# Patient Record
Sex: Female | Born: 1960 | Race: White | Hispanic: No | Marital: Married | Smoking: Never smoker
Health system: Southern US, Community
[De-identification: ages and names within clinical notes are randomized; demographics above are authoritative.]

## PROBLEM LIST (undated history)

## (undated) DIAGNOSIS — N6019 Diffuse cystic mastopathy of unspecified breast: Secondary | ICD-10-CM

## (undated) DIAGNOSIS — K219 Gastro-esophageal reflux disease without esophagitis: Secondary | ICD-10-CM

## (undated) DIAGNOSIS — I341 Nonrheumatic mitral (valve) prolapse: Secondary | ICD-10-CM

## (undated) DIAGNOSIS — K635 Polyp of colon: Secondary | ICD-10-CM

## (undated) HISTORY — DX: Gastro-esophageal reflux disease without esophagitis: K21.9

## (undated) HISTORY — DX: Diffuse cystic mastopathy of unspecified breast: N60.19

## (undated) HISTORY — PX: NO PAST SURGERIES: SHX2092

## (undated) HISTORY — DX: Polyp of colon: K63.5

## (undated) HISTORY — DX: Nonrheumatic mitral (valve) prolapse: I34.1

---

## 2001-02-16 ENCOUNTER — Encounter: Payer: Self-pay | Admitting: Obstetrics and Gynecology

## 2001-02-16 ENCOUNTER — Encounter: Admission: RE | Admit: 2001-02-16 | Discharge: 2001-02-16 | Payer: Self-pay | Admitting: Obstetrics and Gynecology

## 2001-03-17 ENCOUNTER — Encounter (INDEPENDENT_AMBULATORY_CARE_PROVIDER_SITE_OTHER): Payer: Self-pay | Admitting: *Deleted

## 2001-03-17 ENCOUNTER — Ambulatory Visit (HOSPITAL_COMMUNITY): Admission: RE | Admit: 2001-03-17 | Discharge: 2001-03-17 | Payer: Self-pay | Admitting: *Deleted

## 2002-02-28 ENCOUNTER — Encounter: Payer: Self-pay | Admitting: Obstetrics and Gynecology

## 2002-02-28 ENCOUNTER — Encounter: Admission: RE | Admit: 2002-02-28 | Discharge: 2002-02-28 | Payer: Self-pay | Admitting: Obstetrics and Gynecology

## 2002-03-07 ENCOUNTER — Other Ambulatory Visit: Admission: RE | Admit: 2002-03-07 | Discharge: 2002-03-07 | Payer: Self-pay | Admitting: Obstetrics and Gynecology

## 2003-02-13 ENCOUNTER — Encounter: Admission: RE | Admit: 2003-02-13 | Discharge: 2003-02-13 | Payer: Self-pay | Admitting: Obstetrics and Gynecology

## 2003-05-01 ENCOUNTER — Other Ambulatory Visit: Admission: RE | Admit: 2003-05-01 | Discharge: 2003-05-01 | Payer: Self-pay | Admitting: Obstetrics and Gynecology

## 2004-03-25 ENCOUNTER — Encounter: Admission: RE | Admit: 2004-03-25 | Discharge: 2004-03-25 | Payer: Self-pay | Admitting: Obstetrics and Gynecology

## 2004-05-01 ENCOUNTER — Ambulatory Visit (HOSPITAL_COMMUNITY): Admission: RE | Admit: 2004-05-01 | Discharge: 2004-05-01 | Payer: Self-pay | Admitting: *Deleted

## 2004-09-24 ENCOUNTER — Other Ambulatory Visit: Admission: RE | Admit: 2004-09-24 | Discharge: 2004-09-24 | Payer: Self-pay | Admitting: Obstetrics and Gynecology

## 2005-03-11 ENCOUNTER — Encounter: Admission: RE | Admit: 2005-03-11 | Discharge: 2005-03-11 | Payer: Self-pay | Admitting: Obstetrics and Gynecology

## 2005-09-29 ENCOUNTER — Other Ambulatory Visit: Admission: RE | Admit: 2005-09-29 | Discharge: 2005-09-29 | Payer: Self-pay | Admitting: Obstetrics and Gynecology

## 2006-12-25 ENCOUNTER — Other Ambulatory Visit: Admission: RE | Admit: 2006-12-25 | Discharge: 2006-12-25 | Payer: Self-pay | Admitting: Obstetrics and Gynecology

## 2006-12-28 ENCOUNTER — Encounter: Admission: RE | Admit: 2006-12-28 | Discharge: 2006-12-28 | Payer: Self-pay | Admitting: Obstetrics and Gynecology

## 2007-12-29 ENCOUNTER — Encounter: Admission: RE | Admit: 2007-12-29 | Discharge: 2007-12-29 | Payer: Self-pay | Admitting: Obstetrics and Gynecology

## 2007-12-29 ENCOUNTER — Other Ambulatory Visit: Admission: RE | Admit: 2007-12-29 | Discharge: 2007-12-29 | Payer: Self-pay | Admitting: Obstetrics & Gynecology

## 2010-08-20 ENCOUNTER — Other Ambulatory Visit: Payer: Self-pay | Admitting: Obstetrics and Gynecology

## 2010-08-20 DIAGNOSIS — Z1231 Encounter for screening mammogram for malignant neoplasm of breast: Secondary | ICD-10-CM

## 2010-09-03 ENCOUNTER — Ambulatory Visit
Admission: RE | Admit: 2010-09-03 | Discharge: 2010-09-03 | Disposition: A | Discharge status: Home / Self Care | Payer: BC Managed Care – PPO | Source: Ambulatory Visit | Attending: Obstetrics and Gynecology | Admitting: Obstetrics and Gynecology

## 2010-09-03 DIAGNOSIS — Z1231 Encounter for screening mammogram for malignant neoplasm of breast: Secondary | ICD-10-CM

## 2010-09-05 ENCOUNTER — Other Ambulatory Visit: Payer: Self-pay | Admitting: Obstetrics and Gynecology

## 2010-09-05 DIAGNOSIS — R928 Other abnormal and inconclusive findings on diagnostic imaging of breast: Secondary | ICD-10-CM

## 2010-09-11 ENCOUNTER — Other Ambulatory Visit: Payer: Self-pay | Admitting: Obstetrics and Gynecology

## 2010-09-11 ENCOUNTER — Ambulatory Visit
Admission: RE | Admit: 2010-09-11 | Discharge: 2010-09-11 | Disposition: A | Discharge status: Home / Self Care | Payer: BC Managed Care – PPO | Source: Ambulatory Visit | Attending: Obstetrics and Gynecology | Admitting: Obstetrics and Gynecology

## 2010-09-11 DIAGNOSIS — R928 Other abnormal and inconclusive findings on diagnostic imaging of breast: Secondary | ICD-10-CM

## 2011-08-12 ENCOUNTER — Other Ambulatory Visit: Payer: Self-pay | Admitting: Obstetrics & Gynecology

## 2011-08-12 DIAGNOSIS — Z1231 Encounter for screening mammogram for malignant neoplasm of breast: Secondary | ICD-10-CM

## 2011-09-04 ENCOUNTER — Ambulatory Visit: Payer: BC Managed Care – PPO

## 2011-09-08 ENCOUNTER — Ambulatory Visit
Admission: RE | Admit: 2011-09-08 | Discharge: 2011-09-08 | Disposition: A | Discharge status: Home / Self Care | Payer: BC Managed Care – PPO | Source: Ambulatory Visit | Attending: Obstetrics & Gynecology | Admitting: Obstetrics & Gynecology

## 2011-09-08 DIAGNOSIS — Z1231 Encounter for screening mammogram for malignant neoplasm of breast: Secondary | ICD-10-CM

## 2012-05-06 ENCOUNTER — Ambulatory Visit: Payer: Self-pay | Admitting: Certified Nurse Midwife

## 2012-05-10 ENCOUNTER — Encounter: Payer: Self-pay | Admitting: Certified Nurse Midwife

## 2012-05-13 ENCOUNTER — Encounter: Payer: Self-pay | Admitting: Certified Nurse Midwife

## 2012-05-13 ENCOUNTER — Ambulatory Visit (INDEPENDENT_AMBULATORY_CARE_PROVIDER_SITE_OTHER): Payer: BC Managed Care – PPO | Admitting: Certified Nurse Midwife

## 2012-05-13 VITALS — BP 94/60 | Ht 64.75 in | Wt 122.0 lb

## 2012-05-13 DIAGNOSIS — Z0000 Encounter for general adult medical examination without abnormal findings: Secondary | ICD-10-CM

## 2012-05-13 DIAGNOSIS — Z01419 Encounter for gynecological examination (general) (routine) without abnormal findings: Secondary | ICD-10-CM

## 2012-05-13 DIAGNOSIS — N760 Acute vaginitis: Secondary | ICD-10-CM

## 2012-05-13 LAB — LIPID PANEL
Cholesterol: 247 mg/dL — ABNORMAL HIGH (ref 0–200)
HDL: 66 mg/dL (ref >39)
LDL Cholesterol: 166 mg/dL — ABNORMAL HIGH (ref 0–99)
Total CHOL/HDL Ratio: 3.7 Ratio
Triglycerides: 76 mg/dL (ref <150)
VLDL: 15 mg/dL (ref 0–40)

## 2012-05-13 LAB — COMPREHENSIVE METABOLIC PANEL
Alkaline Phosphatase: 89 U/L (ref 39–117)
BUN: 13 mg/dL (ref 6–23)
Chloride: 100 mEq/L (ref 96–112)
Creat: 0.79 mg/dL (ref 0.50–1.10)
Potassium: 5.1 mEq/L (ref 3.5–5.3)
Total Bilirubin: 0.6 mg/dL (ref 0.3–1.2)
Total Protein: 7.5 g/dL (ref 6.0–8.3)

## 2012-05-13 LAB — POCT URINALYSIS DIPSTICK
Bilirubin, UA: NEGATIVE
Blood, UA: NEGATIVE
Clarity, UA: NEGATIVE
Color, UA: NEGATIVE
Ketones, UA: NEGATIVE
Protein, UA: NEGATIVE
pH, UA: 5

## 2012-05-13 NOTE — Progress Notes (Signed)
52 y.o. G72P2002 Married Caucasian Fe here for annual exam. Menopausal now, no vaginal bleeding or spotting since 05/23/11. Used Prometrium 9-13, with no withdrawal bleeding. Using Replens for vaginal dryness, working OK.  Patient describes hot flashes as off and on.  No insomnia issues. Patient was diagnosed with GERD and had negative EGD, under follow up.  No other health issues today.   Patient's last menstrual period was 05/23/2011.          Sexually active: yes  The current method of family planning is none.    Exercising: yes  walking Smoker:  no  Health Maintenance: Pap:  05-06-11 neg pap HPV HR neg MMG:  09-08-11 Colonoscopy:  06-13-11 BMD:   none TDaP:  2007 Labs: Poct urine-neg, Hgb-14.2   reports that she has never smoked. She does not have any smokeless tobacco history on file. She reports that she does not drink alcohol or use illicit drugs.  Past Medical History  Diagnosis Date  . MVP (mitral valve prolapse)   . Fibrocystic breast   . Acid reflux   . Colon polyps     History reviewed. No pertinent past surgical history.  Current Outpatient Prescriptions  Medication Sig Dispense Refill  . Cetirizine HCl (ZYRTEC PO) Take by mouth as needed.      . cycloSPORINE (RESTASIS) 0.05 % ophthalmic emulsion 1 drop 2 (two) times daily.      Marland Kitchen esomeprazole (NEXIUM) 40 MG capsule Take 40 mg by mouth daily before breakfast.      . Inulin (FIBERCHOICE PO) Take by mouth daily. With calcium & vitamin d      . KRILL OIL PO Take by mouth daily.      . Multiple Vitamins-Minerals (MULTIVITAMIN PO) Take by mouth daily.      . Probiotic Product (PHILLIPS COLON HEALTH PO) Take by mouth. occ       No current facility-administered medications for this visit.    Family History  Problem Relation Age of Onset  . Hyperlipidemia Mother   . Hyperlipidemia Father   . Cancer Maternal Grandfather     colon  . Breast cancer Paternal Grandmother     ROS:  Pertinent items are noted in HPI.   Otherwise, a comprehensive ROS was negative.  Exam:   BP 94/60  Ht 5' 4.75" (1.645 m)  Wt 122 lb (55.339 kg)  BMI 20.45 kg/m2  LMP 05/23/2011  Weight change: Height: 5' 4.75" (164.5 cm)  Ht Readings from Last 3 Encounters:  05/13/12 5' 4.75" (1.645 m)    General appearance: alert, cooperative and appears stated age Head: Normocephalic, without obvious abnormality, atraumatic Neck: no adenopathy, supple, symmetrical, trachea midline and thyroid normal to inspection and palpation Lungs: clear to auscultation bilaterally Breasts: normal appearance, no masses or tenderness, No nipple discharge or bleeding Heart: regular rate and rhythm Abdomen: soft, non-tender; no masses,  no organomegaly Extremities: extremities normal, atraumatic, no cyanosis or edema Skin: Skin color, texture, turgor normal. No rashes or lesions Lymph nodes: Cervical, supraclavicular, and axillary nodes normal. No abnormal inguinal nodes palpated Neurologic: Grossly normal   Pelvic: External genitalia:  no lesions              Urethra:  normal appearing urethra with no masses, tenderness or lesions              Bartholin's and Skene's: normal                 Vagina: normal appearing vagina with normal  color   Yellow odorous discharge noted.  Wet prep taken              Cervix: normal appearance              Pap taken: no Bimanual Exam:  Uterus:  normal size, contour, position, consistency, mobility, non-tender and anteverted              Adnexa: normal adnexa and no mass, fullness, tenderness               Rectovaginal: Confirms               Anus:  normal sphincter tone, no lesions    Wet Prep: Clue cells  A:  Well Woman with normal exam  Menopausal no HRT  BV  New diagnosis of GERD  P: Reviewed health and wellness pertinent to exam  Pap smear as per guidelines  Mammogram yearly Patient has Metrogel will use every hs x 5 nights. Restart Replens per OTC instructions, advise if dryness  continues Continue follow up as indicated  return annually or prn  An After Visit Summary was printed and given to the patient.  Reviewed, TL

## 2012-05-13 NOTE — Patient Instructions (Signed)

## 2012-05-14 ENCOUNTER — Telehealth: Payer: Self-pay | Admitting: Orthopedic Surgery

## 2012-05-14 NOTE — Telephone Encounter (Signed)
LMTCB about lab results.  aa

## 2012-05-14 NOTE — Telephone Encounter (Signed)
She will need copy of labs

## 2012-05-14 NOTE — Telephone Encounter (Signed)
Spoke with pt about increased cholesterol levels. Instructed that Dana Tucker would like her to follow up with her PCP for management. Pt agreeable. Instructed that her liver, kidney, and glucose values were normal. Pt has PCP and will make an appt.

## 2012-05-14 NOTE — Telephone Encounter (Signed)
Message copied by Alfredo Batty on Fri May 14, 2012 10:30 AM ------      Message from: Verner Chol      Created: Fri May 14, 2012  7:38 AM       Notify patient that liver, kidney, glucose profile normal      Cholesterol has increased with the overall cholesterol at 247 from 232 and LDL(harmful cholesterol) at 166 from 149      Feel she needs PCP management/follow up now.  If no PCP needs referral      DL ------

## 2012-06-02 ENCOUNTER — Ambulatory Visit (INDEPENDENT_AMBULATORY_CARE_PROVIDER_SITE_OTHER): Payer: BC Managed Care – PPO | Admitting: Obstetrics and Gynecology

## 2012-06-02 ENCOUNTER — Telehealth: Payer: Self-pay | Admitting: Certified Nurse Midwife

## 2012-06-02 VITALS — BP 120/80 | Wt 125.0 lb

## 2012-06-02 DIAGNOSIS — R350 Frequency of micturition: Secondary | ICD-10-CM

## 2012-06-02 DIAGNOSIS — N390 Urinary tract infection, site not specified: Secondary | ICD-10-CM

## 2012-06-02 LAB — POCT URINALYSIS DIPSTICK
Bilirubin, UA: NEGATIVE
Ketones, UA: NEGATIVE
Leukocytes, UA: NEGATIVE
Nitrite, UA: POSITIVE
Protein, UA: NEGATIVE
Urobilinogen, UA: NEGATIVE

## 2012-06-02 MED ORDER — NITROFURANTOIN MONOHYD MACRO 100 MG PO CAPS: 100.0000 mg | ORAL_CAPSULE | Freq: Two times a day (BID) | ORAL | Status: AC

## 2012-06-02 NOTE — Telephone Encounter (Signed)
Patient has developed extreme pain as a result of UTI.

## 2012-06-02 NOTE — Telephone Encounter (Signed)
Thank you for the note.  [employer]

## 2012-06-02 NOTE — Progress Notes (Signed)
Patient ID: Dana Tucker, female   DOB: [birth year], 52 y.o.   MRN: [id number]  Subjective  Patient here for potential urinary tract infection.  Urgency, dysuria starting this am.  No blood in the urine.  No fever.  Had recent intercourse.  Has been taking prophylactic antibiotics for intercourse.  Ran out of antibiotics. Last used Macrobid for prophylaxis.  Patient states headache and nausea, which came on after taking Pyridium.  Thinks is was expired.  Took ibuprofen for headache.   Did not take Nexium.    Objective   urine dip positive for nitrites  Assessment   Urinary tract infection   Plan  Stop Pyridium UC sent. Macrobid 100 mg po bid for 5 days. Rx will have an extra 20 capsules for patient will have abx on hand for prophylaxis for UTI with intercourse. Return for worsening symptoms, nausea, fever, back pain.  After visit summary to patient.

## 2012-06-02 NOTE — Telephone Encounter (Signed)
Pt complains of severe abdominal pain do to possible UTI (increased frequency/burning). I scheduled pt to come in office and see Dr. Edward Jolly at 3:00. Pt states she is currently in [place], [place] and will try to make 3:00 appt. Pt told to call the office if unable to make appt today. I advised pt to go to an urgent care if unable to make appt this afternoon.

## 2012-06-02 NOTE — Patient Instructions (Addendum)
Urinary Tract Infection  Urinary tract infections (UTIs) can develop anywhere along your urinary tract. Your urinary tract is your body's drainage system for removing wastes and extra water. Your urinary tract includes two kidneys, two ureters, a bladder, and a urethra. Your kidneys are a pair of bean-shaped organs. Each kidney is about the size of your fist. They are located below your ribs, one on each side of your spine.  CAUSES  Infections are caused by microbes, which are microscopic organisms, including fungi, viruses, and bacteria. These organisms are so small that they can only be seen through a microscope. Bacteria are the microbes that most commonly cause UTIs.  SYMPTOMS   Symptoms of UTIs may vary by age and gender of the patient and by the location of the infection. Symptoms in young women typically include a frequent and intense urge to urinate and a painful, burning feeling in the bladder or urethra during urination. Older women and men are more likely to be tired, shaky, and weak and have muscle aches and abdominal pain. A fever may mean the infection is in your kidneys. Other symptoms of a kidney infection include pain in your back or sides below the ribs, nausea, and vomiting.  DIAGNOSIS  To diagnose a UTI, your caregiver will ask you about your symptoms. Your caregiver also will ask to provide a urine sample. The urine sample will be tested for bacteria and white blood cells. White blood cells are made by your body to help fight infection.  TREATMENT   Typically, UTIs can be treated with medication. Because most UTIs are caused by a bacterial infection, they usually can be treated with the use of antibiotics. The choice of antibiotic and length of treatment depend on your symptoms and the type of bacteria causing your infection.  HOME CARE INSTRUCTIONS   If you were prescribed antibiotics, take them exactly as your caregiver instructs you. Finish the medication even if you feel better after you  have only taken some of the medication.   Drink enough water and fluids to keep your urine clear or pale yellow.   Avoid caffeine, tea, and carbonated beverages. They tend to irritate your bladder.   Empty your bladder often. Avoid holding urine for long periods of time.   Empty your bladder before and after sexual intercourse.   After a bowel movement, women should cleanse from front to back. Use each tissue only once.  SEEK MEDICAL CARE IF:    You have back pain.   You develop a fever.   Your symptoms do not begin to resolve within 3 days.  SEEK IMMEDIATE MEDICAL CARE IF:    You have severe back pain or lower abdominal pain.   You develop chills.   You have nausea or vomiting.   You have continued burning or discomfort with urination.  MAKE SURE YOU:    Understand these instructions.   Will watch your condition.   Will get help right away if you are not doing well or get worse.  Document Released: 10/09/2004 Document Revised: 07/01/2011 Document Reviewed: 02/07/2011  ExitCare Patient Information 2014 [employer].

## 2012-06-04 ENCOUNTER — Other Ambulatory Visit: Payer: Self-pay | Admitting: Obstetrics and Gynecology

## 2012-06-04 LAB — URINE CULTURE

## 2012-06-04 MED ORDER — CIPROFLOXACIN HCL 500 MG PO TABS: 500.0000 mg | ORAL_TABLET | Freq: Two times a day (BID) | ORAL | Status: DC

## 2012-06-09 ENCOUNTER — Telehealth: Payer: Self-pay | Admitting: Certified Nurse Midwife

## 2012-06-09 NOTE — Telephone Encounter (Signed)
LMTCB re: message from [place].  aa

## 2012-06-09 NOTE — Telephone Encounter (Signed)
Spoke with pt about symptoms. Pt used metronidazole as directed x 5 days and tried the Replens QD x 7, then decreased down to twice weekly. Pt having "globs of white gritty stuff like ricotta" that she is not sure how to get rid of. Causing increased irritation, redness, and some swelling.  Pt trying to get rid of UTI and was afraid to take a tub bath. Pt was told by DL to call back if no relief to the dryness. Pt thought an estrogen cream was the next step. Please advise.

## 2012-06-09 NOTE — Telephone Encounter (Signed)
Patient stated that she has spoken thoroughly with Leota Sauers concerning her issue of vaginal dryness. Patient stated that the issue hasn't improved but has actually gotten worse. Patient stated that she was told by Gavin Pound to call the office if this problem did not get better after trying the different solutions that Gavin Pound offered. Patient stated that she uses the [facility] on the corner of [address]

## 2012-06-09 NOTE — Telephone Encounter (Signed)
Spoke with pt about DL advice. Pt will try it and call us back if continued problems.

## 2012-06-09 NOTE — Telephone Encounter (Signed)
The vaginal discharge may be her body trying to re normalize after treating for infection. Have patient try aveeno sitz bath for symptom relief and advise if resolving, if so restart Replens daily

## 2012-06-09 NOTE — Telephone Encounter (Signed)
Patient returned Amy's call. 

## 2012-06-17 ENCOUNTER — Ambulatory Visit (INDEPENDENT_AMBULATORY_CARE_PROVIDER_SITE_OTHER): Payer: BC Managed Care – PPO | Admitting: Certified Nurse Midwife

## 2012-06-17 ENCOUNTER — Encounter: Payer: Self-pay | Admitting: Certified Nurse Midwife

## 2012-06-17 VITALS — BP 110/60 | HR 78 | Resp 16 | Wt 122.1 lb

## 2012-06-17 DIAGNOSIS — B3731 Acute candidiasis of vulva and vagina: Secondary | ICD-10-CM

## 2012-06-17 DIAGNOSIS — B373 Candidiasis of vulva and vagina: Secondary | ICD-10-CM

## 2012-06-17 DIAGNOSIS — N390 Urinary tract infection, site not specified: Secondary | ICD-10-CM

## 2012-06-17 MED ORDER — FLUCONAZOLE 150 MG PO TABS: 150.0000 mg | ORAL_TABLET | Freq: Once | ORAL | Status: DC

## 2012-06-17 NOTE — Progress Notes (Signed)
51 y.o. Married Caucasian female G2P2002 here for follow up of Klebisella UTI treated with Cipro initiated on 06-02-12. Completed all medication as directed.  Denies any symptoms of urinary frequency, urgency, or pain.  Complaining of excessive vaginal discharge, cottage cheese in consistency, no itching, for the past week.  No new personal products. No pain with sexual activity.    O: Healthy WD,WN female Affect:normal orientation x 3  Skin: warm and dry Abdomen:soft, non tender, negative suprapubic CVAT: negative Pelvic exam:EXTERNAL GENITALIA: normal appearing vulva with no masses, tenderness or lesions VAGINA: white thick copious discharge, Wet Prep/KOH  , positive hyphae, pH 4.0 and negative clue cells and negative trich BUS/bladder/urethra: non tender CERVIX: normal, non tender    A: UTI probably resolved 2-Yeast vaginitis  P: Discussed findings of UTI probably resolved 2- Discussed findings of yeast Rx Diflucan see order   Aveeno sitz bath prn comfort, start on oral probiotic   Labs:Urine culture   Rv prn Reviewed, TL

## 2012-07-22 ENCOUNTER — Other Ambulatory Visit: Payer: Self-pay | Admitting: Family Medicine

## 2012-07-22 ENCOUNTER — Ambulatory Visit
Admission: RE | Admit: 2012-07-22 | Discharge: 2012-07-22 | Disposition: A | Discharge status: Home / Self Care | Payer: BC Managed Care – PPO | Source: Ambulatory Visit | Attending: Family Medicine | Admitting: Family Medicine

## 2012-07-22 DIAGNOSIS — R0789 Other chest pain: Secondary | ICD-10-CM

## 2012-08-09 ENCOUNTER — Encounter: Payer: Self-pay | Admitting: Obstetrics & Gynecology

## 2012-08-10 ENCOUNTER — Ambulatory Visit (INDEPENDENT_AMBULATORY_CARE_PROVIDER_SITE_OTHER): Payer: BC Managed Care – PPO | Admitting: Certified Nurse Midwife

## 2012-08-10 ENCOUNTER — Encounter: Payer: Self-pay | Admitting: Obstetrics & Gynecology

## 2012-08-10 ENCOUNTER — Encounter: Payer: Self-pay | Admitting: Certified Nurse Midwife

## 2012-08-10 VITALS — BP 98/60 | HR 64 | Temp 97.9°F | Resp 16 | Ht 64.75 in | Wt 124.0 lb

## 2012-08-10 DIAGNOSIS — B3731 Acute candidiasis of vulva and vagina: Secondary | ICD-10-CM

## 2012-08-10 DIAGNOSIS — B373 Candidiasis of vulva and vagina: Secondary | ICD-10-CM

## 2012-08-10 MED ORDER — NYSTATIN-TRIAMCINOLONE 100000-0.1 UNIT/GM-% EX OINT: TOPICAL_OINTMENT | Freq: Two times a day (BID) | CUTANEOUS | Status: DC

## 2012-08-10 MED ORDER — FLUCONAZOLE 150 MG PO TABS
150.0000 mg | ORAL_TABLET | Freq: Once | ORAL | Status: DC
Start: 2012-08-10 — End: 2013-01-04

## 2012-08-10 NOTE — Progress Notes (Signed)
Note reviewed, agree with plan.  Tahisha Hakim, MD  

## 2012-08-10 NOTE — Progress Notes (Signed)
52 y.o.MarriedCaucasian female [phone] with a 7 day(s) history of the following:discharge described as green and thick, dyspareunia and external irritation Sexually active: yes Last sexual activity:7days ago. Pt also reports the following associated symptoms: painful. Patient has tried over the counter treatment with minimal relief. Questionable vaginal dryness again. No new personal products. Patient went to beach and was in hot tub several times, which seemed to start the symptoms..     Exam:  AVW:UJWJXB, Bartholin's, Urethra, Skene's normal                Vag:pH 4.0, wet prep done, white thick discharge, no odor, slight increase pink at introitus, no atrophy                Cx:  normal appearance                Uterus:normal shape and consistency, anteflexed                Adnexa: normal adnexa and no mass, fullness, tenderness  Wet Prep shows:Positive for yeast, negative for BV and Trich  A: Yeast vaginitis 2-Vaginal dryness  P: Reviewed findings and feel continued use of hot tub contributed to vaginal dryness and yeast production. Instructed to limit exposure for prolonged periods of time and apply Olive oil at introitus for protection prior to use. Rx: Diflucan see order [id number] see order 2-Discussed resuming Replens use as before until dryness resolves, can continue 2 x weekly for maintenance   Rv prn

## 2012-08-24 ENCOUNTER — Other Ambulatory Visit: Payer: Self-pay

## 2012-08-24 DIAGNOSIS — Z1231 Encounter for screening mammogram for malignant neoplasm of breast: Secondary | ICD-10-CM

## 2012-08-25 ENCOUNTER — Encounter: Payer: Self-pay | Admitting: Certified Nurse Midwife

## 2012-09-09 ENCOUNTER — Ambulatory Visit
Admission: RE | Admit: 2012-09-09 | Discharge: 2012-09-09 | Disposition: A | Discharge status: Home / Self Care | Payer: BC Managed Care – PPO | Source: Ambulatory Visit

## 2012-09-09 DIAGNOSIS — Z1231 Encounter for screening mammogram for malignant neoplasm of breast: Secondary | ICD-10-CM

## 2012-10-13 ENCOUNTER — Encounter: Payer: Self-pay | Admitting: Certified Nurse Midwife

## 2012-11-18 ENCOUNTER — Other Ambulatory Visit: Payer: Self-pay

## 2012-12-24 ENCOUNTER — Ambulatory Visit (INDEPENDENT_AMBULATORY_CARE_PROVIDER_SITE_OTHER): Payer: BC Managed Care – PPO | Admitting: Certified Nurse Midwife

## 2012-12-24 ENCOUNTER — Encounter: Payer: Self-pay | Admitting: Certified Nurse Midwife

## 2012-12-24 VITALS — BP 108/60 | HR 74 | Resp 18 | Wt 130.0 lb

## 2012-12-24 DIAGNOSIS — B373 Candidiasis of vulva and vagina: Secondary | ICD-10-CM

## 2012-12-24 DIAGNOSIS — B3731 Acute candidiasis of vulva and vagina: Secondary | ICD-10-CM

## 2012-12-24 LAB — HEMOGLOBIN A1C: Mean Plasma Glucose: 108 mg/dL (ref <117)

## 2012-12-24 MED ORDER — TERCONAZOLE 0.4 % VA CREA: 1.0000 | TOPICAL_CREAM | Freq: Every day | VAGINAL | Status: DC

## 2012-12-24 NOTE — Progress Notes (Signed)
52 y.o.MarriedCaucasian female [phone] with a 3 week(s) history of the following:burning, discharge described as clear and dyspareunia Sexually active: yes Last sexual activity:3weeks ago. Pt also reports the following associated symptoms: none Patient has tried over the counter treatment with no relief. Tried OTC Monistat 7 with no change and continues to have vaginal irritation.Denies vaginal dryness, uses OTC Replens and vaginal lubricant for sexual activity. No new personal products, except dryer sheets. No vaginal bleeding.  O: Healthy female, WDWN Affect: normal, orientation x 3    Exam:  AVW:[id number]'W, Urethra, Skene's normal, slight increase pink with exudate, no lesions wet prep done                GNF:AOZHYQMVH: white, thick and odorless, pH 4.0, wet prep done                Cx:  normal appearance and non tender                Uterus:non-tender, normal shape and consistency                Adnexa: normal adnexa and no mass, fullness, tenderness  Wet Prep shows: Positive for yeast vaginal and vulva   A: Yeast vaginitis and vulvitis ? Chronic  P:Reviewed findings of yeast again. Rx Terazol 7 see order Discussed screen for diabetes which can increase occurrence of yeast. Lab Hgb A1-c Start on probiotics, refrigerated type as before and take daily. Recheck in 1 1/2 weeks. Patient has Mycolog and will treat bid x 5 days to external tissue.    RV as above

## 2012-12-28 NOTE — Progress Notes (Signed)
Reviewed personally.  M. Suzanne Lorine Iannaccone, MD.  

## 2013-01-04 ENCOUNTER — Ambulatory Visit (INDEPENDENT_AMBULATORY_CARE_PROVIDER_SITE_OTHER): Payer: BC Managed Care – PPO | Admitting: Certified Nurse Midwife

## 2013-01-04 ENCOUNTER — Encounter: Payer: Self-pay | Admitting: Certified Nurse Midwife

## 2013-01-04 VITALS — BP 108/64 | HR 68 | Resp 16 | Ht 64.75 in | Wt 127.0 lb

## 2013-01-04 DIAGNOSIS — B3731 Acute candidiasis of vulva and vagina: Secondary | ICD-10-CM

## 2013-01-04 DIAGNOSIS — N949 Unspecified condition associated with female genital organs and menstrual cycle: Secondary | ICD-10-CM

## 2013-01-04 DIAGNOSIS — N9489 Other specified conditions associated with female genital organs and menstrual cycle: Secondary | ICD-10-CM

## 2013-01-04 DIAGNOSIS — B373 Candidiasis of vulva and vagina: Secondary | ICD-10-CM

## 2013-01-04 DIAGNOSIS — N952 Postmenopausal atrophic vaginitis: Secondary | ICD-10-CM

## 2013-01-04 LAB — POCT URINALYSIS DIPSTICK
Bilirubin, UA: NEGATIVE
Blood, UA: NEGATIVE
Glucose, UA: NEGATIVE
Ketones, UA: NEGATIVE
Protein, UA: NEGATIVE
Urobilinogen, UA: NEGATIVE

## 2013-01-04 NOTE — Progress Notes (Signed)
52 y.o. Married [place] American female [phone] here for follow up of Yeast vaginitis treated with Terazol 7 cream and Mycolog initiated on 12/24/12. Completed all medication as directed.  Denies any symptoms of vaginal discharge, itching or burning. Still some irritation externally, no itching, or lesions or blisters. Patient works out, but changes out of clothes soon after work out. Denies vaginal dryness. No other issues today  O: Healthy WD,WN female Affect: Normal, orientation x 3 Pelvic exam:EXTERNAL GENITALIA: normal appearing vulva with no masses, tenderness or lesions, very slight increase in pink, no exudate or scaling noted, wet prep taken VAGINA: no abnormal discharge or lesions, Wet Prep/KOH no pathogens and ph 4.0 CERVIX: no lesions or cervical motion tenderness and normal appearance UTERUS: normal, non tender ADNEXA: no masses palpable and nontender RECTUM: exam not indicated  A:Yeast vaginitis/vulvitis Resolved  Vulva dryness  P: Discussed findings of yeast resolved and no further treatment needed. Discussed dry appearance and instructed to apply Olive oil external and inside vulva for moisture and protection. Stop using dryer sheets which maybe causing irritation. Patient agreeable. Questions addressed.   RV prn

## 2013-01-04 NOTE — Patient Instructions (Signed)
Atrophic Vaginitis Atrophic vaginitis is a problem of low levels of estrogen in women. This problem can happen at any age. It is most common in women who have gone through menopause ("the change").  HOW WILL I KNOW IF I HAVE THIS PROBLEM? You may have:  Trouble with peeing (urinating), such as:  Going to the bathroom often.  A hard time holding your pee until you reach a bathroom.  Leaking pee.  Having pain when you pee.  Itching or a burning feeling.  Vaginal bleeding and spotting.  Pain during sex.  Dryness of the vagina.  A yellow, bad-smelling fluid (discharge) coming from the vagina. HOW WILL MY DOCTOR CHECK FOR THIS PROBLEM?  During your exam, your doctor will likely find the problem.  If there is a vaginal fluid, it may be checked for infection. HOW WILL THIS PROBLEM BE TREATED? Keep the vulvar skin as clean as possible. Moisturizers and lubricants can help with some of the symptoms. Estrogen replacement can help. There are 2 ways to take estrogen:  Systemic estrogen gets estrogen to your whole body. It takes many weeks or months before the symptoms get better.  You take an estrogen pill.  You use a skin patch. This is a patch that you put on your skin.  If you still have your uterus, your doctor may ask you to take a hormone. Talk to your doctor about the right medicine for you.  Estrogen cream.  This puts estrogen only at the part of your body where you apply it. The cream is put into the vagina or put on the vulvar skin. For some women, estrogen cream works faster than pills or the patch. CAN ALL WOMEN WITH THIS PROBLEM USE ESTROGEN? No. Women with certain types of cancer, liver problems, or problems with blood clots should not take estrogen. Your doctor can help you decide the best treatment for your symptoms. Document Released: 06/18/2007 Document Revised: 03/24/2011 Document Reviewed: 06/18/2007 ExitCare Patient Information 2014 [employer].  

## 2013-01-05 NOTE — Progress Notes (Signed)
Reviewed personally.  M. Suzanne Yakov Bergen, MD.  

## 2013-02-03 ENCOUNTER — Ambulatory Visit (INDEPENDENT_AMBULATORY_CARE_PROVIDER_SITE_OTHER): Payer: BC Managed Care – PPO | Admitting: Certified Nurse Midwife

## 2013-02-03 VITALS — BP 118/80 | HR 74 | Resp 12 | Ht 64.75 in | Wt 125.0 lb

## 2013-02-03 DIAGNOSIS — N952 Postmenopausal atrophic vaginitis: Secondary | ICD-10-CM

## 2013-02-03 DIAGNOSIS — IMO0002 Reserved for concepts with insufficient information to code with codable children: Secondary | ICD-10-CM

## 2013-02-03 MED ORDER — ESTRADIOL 10 MCG VA TABS: ORAL_TABLET | VAGINAL | Status: DC

## 2013-02-03 NOTE — Progress Notes (Signed)
53 y.o.MarriedCaucasian female [phone] with a 9 day(s) history of the following:discharge described as normal and physiologic and dyspareunia Sexually active: yes Last sexual activity:7days ago. Pt also reports the following associated symptoms: none. Patient denies any vaginal itching or burning, just tender. No new personal products. Patient using daily Olive oil with some relief.  O: Healthy  WDWN female Affect: normal, orientation x 3    Exam:  AVW:[id number]:Bartholin's, Urethra, Skene's normal, no lesion, scaling or exudate                GNF:AOZHYQMVHVag:discharge: scant and odorless, pH 4.5, wet prep done, vaginal tissue pale, thin appearance with increase pink at introitus with tenderness                Cx:  normal appearance and non tender                Uterus:non-tender, normal shape and consistency                Adnexa: normal adnexa and no mass, fullness, tenderness  Wet Prep shows:no pathogens   A:atrophic vaginitis Negative wet prep  P: Discussed findings with patient and that Olive Oil is not controlling symptoms and discomfort. Discussed estrogen use with risks and benefits and expectations. Patient agreeable to try. Encouraged to avoid intercourse until recheck, and to use Olive Oil for comfort external if needed.Questions addressed at length. Rx Vagifem see order Recheck in one month Educational materials distributed.  RV prn

## 2013-02-09 ENCOUNTER — Encounter: Payer: Self-pay | Admitting: Certified Nurse Midwife

## 2013-02-09 NOTE — Progress Notes (Signed)
Reviewed personally.  M. Suzanne Ritesh Opara, MD.  

## 2013-03-07 ENCOUNTER — Encounter: Payer: Self-pay | Admitting: Certified Nurse Midwife

## 2013-03-07 ENCOUNTER — Ambulatory Visit (INDEPENDENT_AMBULATORY_CARE_PROVIDER_SITE_OTHER): Payer: BC Managed Care – PPO | Admitting: Certified Nurse Midwife

## 2013-03-07 VITALS — BP 120/80 | HR 70 | Resp 16 | Ht 64.75 in | Wt 128.0 lb

## 2013-03-07 DIAGNOSIS — N952 Postmenopausal atrophic vaginitis: Secondary | ICD-10-CM

## 2013-03-07 NOTE — Patient Instructions (Signed)
Atrophic Vaginitis Atrophic vaginitis is a problem of low levels of estrogen in women. This problem can happen at any age. It is most common in women who have gone through menopause ("the change").  HOW WILL I KNOW IF I HAVE THIS PROBLEM? You may have:  Trouble with peeing (urinating), such as:  Going to the bathroom often.  A hard time holding your pee until you reach a bathroom.  Leaking pee.  Having pain when you pee.  Itching or a burning feeling.  Vaginal bleeding and spotting.  Pain during sex.  Dryness of the vagina.  A yellow, bad-smelling fluid (discharge) coming from the vagina. HOW WILL MY DOCTOR CHECK FOR THIS PROBLEM?  During your exam, your doctor will likely find the problem.  If there is a vaginal fluid, it may be checked for infection. HOW WILL THIS PROBLEM BE TREATED? Keep the vulvar skin as clean as possible. Moisturizers and lubricants can help with some of the symptoms. Estrogen replacement can help. There are 2 ways to take estrogen:  Systemic estrogen gets estrogen to your whole body. It takes many weeks or months before the symptoms get better.  You take an estrogen pill.  You use a skin patch. This is a patch that you put on your skin.  If you still have your uterus, your doctor may ask you to take a hormone. Talk to your doctor about the right medicine for you.  Estrogen cream.  This puts estrogen only at the part of your body where you apply it. The cream is put into the vagina or put on the vulvar skin. For some women, estrogen cream works faster than pills or the patch. CAN ALL WOMEN WITH THIS PROBLEM USE ESTROGEN? No. Women with certain types of cancer, liver problems, or problems with blood clots should not take estrogen. Your doctor can help you decide the best treatment for your symptoms. Document Released: 06/18/2007 Document Revised: 03/24/2011 Document Reviewed: 06/18/2007 ExitCare Patient Information 2014 [employer].  

## 2013-03-07 NOTE — Progress Notes (Signed)
53 y.o. Married Caucasian female G2P2002 here for follow up of Atrophic vaginitis treated with Vagifem initiated on 02/03/13. Patient completed 2 week loading dose and now using 2 times weekly. Denies vaginal itching and pain with intercourse now. Still has slight tenderness at entrance of vagina only. Worried it might be yeast again, but no symptoms as before. No new personal products, no vaginal bleeding. Now on Lipitor for elevated cholesterol with PCP management. No other health issues today.   O: Healthy WD,WN female Affect: normal, orientation x 3  Pelvic exam:EXTERNAL GENITALIA: normal appearing vulva with no masses, tenderness or lesions VAGINA: no abnormal discharge or lesions and discharge appears normal with moisture and no discomfort with speculum use. Wet Prep taken. Ph 4.0 Slight redness at introitus only, no scaling or tenderness CERVIX: no lesions or cervical motion tenderness and normal appearance.  Wet Prep: negative  A:Atrophic Vaginitis responding well to Vagifem Negative wet prep, no yeast noted   P: Discussed findings of normalizing appearance in vagina with Vagifem use. Discussed continued use to provide normal moisture an decrease dryness. Patient agrees. Rx already in from last visit. Reviewed no yeast!! Discussed slight dryness noted at introitus, but should improve with continued use. Encouraged continued use of moisturizer for sexual activity.  Rv aex, 5/15

## 2013-03-08 NOTE — Progress Notes (Signed)
Reviewed personally.  M. Suzanne Shonica Weier, MD.  

## 2013-03-28 ENCOUNTER — Encounter: Payer: Self-pay | Admitting: Certified Nurse Midwife

## 2013-04-12 ENCOUNTER — Other Ambulatory Visit: Payer: Self-pay | Admitting: Family Medicine

## 2013-04-12 ENCOUNTER — Ambulatory Visit
Admission: RE | Admit: 2013-04-12 | Discharge: 2013-04-12 | Disposition: A | Discharge status: Home / Self Care | Payer: BC Managed Care – PPO | Source: Ambulatory Visit | Attending: Family Medicine | Admitting: Family Medicine

## 2013-04-12 DIAGNOSIS — M62838 Other muscle spasm: Secondary | ICD-10-CM

## 2013-05-02 ENCOUNTER — Telehealth: Payer: Self-pay | Admitting: Certified Nurse Midwife

## 2013-05-02 DIAGNOSIS — N950 Postmenopausal bleeding: Secondary | ICD-10-CM

## 2013-05-02 NOTE — Telephone Encounter (Signed)
Thank you for doing a great job in preparing for the patient's visit.

## 2013-05-02 NOTE — Telephone Encounter (Signed)
Spoke with patient. She states that since after intercourse on Saturday she has been having brown and tinge of red spotting, she is wearing a liner only. She is concerned that she is post menopausal and having vaginal bleeding. Advised patient that office visit is suggested for evaluation. Patient requests Tuesday or Wednesday appointment, declines appointment today. Advised she would be given an appointment with an MD for evaluation. This could be r/t vaginal atrophy but that it must be ruled out first. Patient is agreeable.  Endometrial bx sent for precert. Wednesday appointment with Dr. Edward JollySilva scheduled for 0930. Patient is agreeable to plan.  Cc Verner Choleborah S. Leonard CNM

## 2013-05-02 NOTE — Telephone Encounter (Signed)
Patient calling to schedule an appointment for "unusual bleeding and spotting". Patient reports she is post menopausal.

## 2013-05-04 ENCOUNTER — Encounter: Payer: Self-pay | Admitting: Obstetrics and Gynecology

## 2013-05-04 ENCOUNTER — Ambulatory Visit (INDEPENDENT_AMBULATORY_CARE_PROVIDER_SITE_OTHER): Payer: BC Managed Care – PPO | Admitting: Obstetrics and Gynecology

## 2013-05-04 VITALS — BP 104/58 | HR 70 | Ht 64.75 in | Wt 122.6 lb

## 2013-05-04 DIAGNOSIS — N950 Postmenopausal bleeding: Secondary | ICD-10-CM

## 2013-05-04 NOTE — Patient Instructions (Signed)
Endometrial Biopsy, Care After Refer to this sheet in the next few weeks. These instructions provide you with information on caring for yourself after your procedure. Your health care provider may also give you more specific instructions. Your treatment has been planned according to current medical practices, but problems sometimes occur. Call your health care provider if you have any problems or questions after your procedure. WHAT TO EXPECT AFTER THE PROCEDURE After your procedure, it is typical to have the following:  You may have mild cramping and a small amount of vaginal bleeding for a few days after the procedure. This is normal. HOME CARE INSTRUCTIONS  Only take over-the-counter or prescription medicine as directed by your health care provider.  Do not douche, use tampons, or have sexual intercourse until your health care provider approves.  Follow your health care provider's instructions regarding any activity restrictions, such as strenuous exercise or heavy lifting. SEEK MEDICAL CARE IF:  You have heavy bleeding or bleeding longer than 2 days after the procedure.  You have bad smelling drainage from your vagina.  You have a fever and chills.  Youhave severe lower stomach (abdominal) pain. SEEK IMMEDIATE MEDICAL CARE IF:  You have severe cramps in your stomach or back.  You pass large blood clots.  Your bleeding increases.  You become weak or lightheaded, or you pass out. Document Released: 10/20/2012 Document Reviewed: 06/16/2012 Exit[employer] Patient Information 2014 [employer].  

## 2013-05-04 NOTE — Progress Notes (Addendum)
Patient ID: [name], female   DOB: 08/25/1960, 53 y.o.   MRN: [id number] GYNECOLOGY VISIT  PCP:   Tally Joeavid Swayne, MD  Referring provider:   HPI: 53 y.o.   Married  Caucasian  female   G2P2002 with Patient's last menstrual period was 05/18/2011.   here for  Abnormal uterine bleeding after intercourse. Brownish red bleeding.  Intercourse on 04/30/13 after one month of no intercourse. No pain with intercourse. No instrumentation used.  Bleeding since. Cramping.   Started Vagifem for atrophic vaginitis about 4 months ago. Using twice a week.  Some cramping with Vagifem use.   No other hormonal therapy use.   UPT:  Negative.   GYNECOLOGIC HISTORY: Patient's last menstrual period was 05/18/2011. Sexually active:  yes Partner preference: female Contraception:  postmenopausal  Menopausal hormone therapy: Vagifem DES exposure:  no  Blood transfusions:  no  Sexually transmitted diseases:   no GYN procedures and prior surgeries:  no Last mammogram:  08-2812 wnl:[facility]                 Last pap and high risk HPV testing: 05-06-11 wnl:neg HR HPV  History of abnormal pap smear:  no   OB History   Grav Para Term Preterm Abortions TAB SAB Ect Mult Living   2 2 2       2        LIFESTYLE: Exercise:   walking            Tobacco:   no Alcohol:      rarely Drug use:   no  Patient Active Problem List   Diagnosis Date Noted  . Atrophic vaginitis 02/03/2013    Class: Acute    Past Medical History  Diagnosis Date  . MVP (mitral valve prolapse)   . Fibrocystic breast   . Acid reflux   . Colon polyps     History reviewed. No pertinent past surgical history.  Current Outpatient Prescriptions  Medication Sig Dispense Refill  . atorvastatin (LIPITOR) 20 MG tablet daily. Take 1/2      . Estradiol (VAGIFEM) 10 MCG TABS vaginal tablet Insert one tablet nightly x 2 weeks, then one twice weekly  18 tablet  12  . Inulin (FIBERCHOICE PO) Take by mouth daily. With  calcium & vitamin d      . KRILL OIL PO Take by mouth daily.      . Multiple Vitamins-Minerals (MULTIVITAMIN PO) Take by mouth daily.      . Probiotic Product (PHILLIPS COLON HEALTH PO) Take by mouth. occ       No current facility-administered medications for this visit.     ALLERGIES: Sulfa antibiotics  Family History  Problem Relation Age of Onset  . Hyperlipidemia Mother   . Hyperlipidemia Father   . Cancer Maternal Grandfather     colon  . Diabetes Maternal Grandfather   . Breast cancer Paternal Grandmother     History   Social History  . Marital Status: Married    Spouse Name: N/A    Number of Children: N/A  . Years of Education: N/A   Occupational History  . Not on file.   Social History Main Topics  . Smoking status: Never Smoker   . Smokeless tobacco: Never Used  . Alcohol Use: 0.5 oz/week    1 drink(s) per week     Comment: occ glass of wine  . Drug Use: No  . Sexual Activity: Yes    Partners: Male  Birth Control/ Protection: Condom, None, Post-menopausal   Other Topics Concern  . Not on file   Social History Narrative  . No narrative on file    ROS:  Pertinent items are noted in HPI.  PHYSICAL EXAMINATION:    BP 104/58  Pulse 70  Ht 5' 4.75" (1.645 m)  Wt 122 lb 9.6 oz (55.611 kg)  BMI 20.55 kg/m2  LMP 05/18/2011   Wt Readings from Last 3 Encounters:  05/04/13 122 lb 9.6 oz (55.611 kg)  03/07/13 128 lb (58.06 kg)  02/03/13 125 lb (56.7 kg)     Ht Readings from Last 3 Encounters:  05/04/13 5' 4.75" (1.645 m)  03/07/13 5' 4.75" (1.645 m)  02/03/13 5' 4.75" (1.645 m)    General appearance: alert, cooperative and appears stated age. Abdomen: soft, non-tender; no masses,  no organomegaly No abnormal inguinal nodes palpated Neurologic: Grossly normal  Pelvic: External genitalia:  no lesions              Urethra:  normal appearing urethra with no masses, tenderness or lesions              Bartholins and Skenes: normal                  Vagina: normal appearing vagina with normal color and discharge, no lesions.  Dark brownish mucousy discharge.  Small spot of pink blood on cervix.  No lesions.              Cervix: normal appearance                 Bimanual Exam:  Uterus:  uterus is normal size, shape, consistency and nontender                                      Adnexa: normal adnexa in size, nontender and no masses                Endometrial biopsy -   Consent performed.  Speculum placed in vagina.  Cleansing with Hibiclens. Tenaculum to anterior cervical lip.  Paracervical block with 1% lidocaine - 10 cc.  Addendum - Lot number -    [id number]    [id number]     EXP - 08/13/13 Pipelle passed twice. Tissue to pathology. No complications.                           ASSESSMENT  Postmenopausal bleeding.  Atrophic vaginitis.   PLAN  Discussion with patient regarding potential etiologies of postmenopausal bleeding.  Questions invited and answered.  EMB sent to pathology.  OK to continue with Vagifem but not place while having red bleeding from procedure today.  I discussed sonohysterogram with the patient. She will return for this.    15 minutes face to face time discussion postmenopausal bleeding of which over 50% was spent in counseling.   An After Visit Summary was printed and given to the patient.

## 2013-05-06 LAB — IPS OTHER TISSUE BIOPSY

## 2013-05-09 ENCOUNTER — Telehealth: Payer: Self-pay | Admitting: Obstetrics and Gynecology

## 2013-05-09 NOTE — Telephone Encounter (Signed)
Spoke with patient. Advised of $25 copay quoted by insurance for [facility]HGM performed in the office. Scheduled appt. Advised patient of cancellation policy/fee. Patient agreeable.  Mailed the In-Office procedure form that includes appointment date and time, patient copay, and cancellation policy.

## 2013-05-16 ENCOUNTER — Ambulatory Visit: Payer: BC Managed Care – PPO | Admitting: Certified Nurse Midwife

## 2013-05-26 ENCOUNTER — Other Ambulatory Visit: Payer: BC Managed Care – PPO

## 2013-05-26 ENCOUNTER — Ambulatory Visit (INDEPENDENT_AMBULATORY_CARE_PROVIDER_SITE_OTHER): Payer: BC Managed Care – PPO | Admitting: Obstetrics and Gynecology

## 2013-05-26 ENCOUNTER — Encounter: Payer: Self-pay | Admitting: Obstetrics and Gynecology

## 2013-05-26 ENCOUNTER — Other Ambulatory Visit: Payer: BC Managed Care – PPO | Admitting: Obstetrics and Gynecology

## 2013-05-26 ENCOUNTER — Other Ambulatory Visit: Payer: Self-pay | Admitting: Obstetrics and Gynecology

## 2013-05-26 ENCOUNTER — Ambulatory Visit (INDEPENDENT_AMBULATORY_CARE_PROVIDER_SITE_OTHER): Payer: BC Managed Care – PPO

## 2013-05-26 VITALS — BP 140/90

## 2013-05-26 DIAGNOSIS — N9489 Other specified conditions associated with female genital organs and menstrual cycle: Secondary | ICD-10-CM

## 2013-05-26 DIAGNOSIS — N950 Postmenopausal bleeding: Secondary | ICD-10-CM

## 2013-05-26 MED ORDER — FLUCONAZOLE 150 MG PO TABS: 150.0000 mg | ORAL_TABLET | Freq: Once | ORAL | Status: DC

## 2013-05-26 NOTE — Progress Notes (Signed)
  Subjective  Patient is here for a sonohysterogram for postmenopausal bleeding. Had an EMB showing atrophy of the endometrium.  Uses Vagifem.  Recent antibiotics.  Objective  Ultrasound showing endometrium 4.04 with echogenic focus, no fibroids, ovaries normal, no free fluid.     Procedure - sonohysterogram Consent performed. Speculum placed in vagina. Clumpy white thick vaginal discharge noted. Sterile prep of cervix with betadine. Cannula placed inside endometrial cavity without difficult. Speculum removed. Sterile saline injected.    One 7 mm  filling defect noted in fundal region. Cannula removed. No complication.   Assessment  Postmenopausal bleeding.   Endometrial mass. Negative endometrial biopsy. Yeast vaginitis.   Plan  Diflucan.  See Epic. Discussion regarding findings of suspected endometrial polyp as the source of postmenopausal bleeding. Plan for hysteroscopic polypectomy with dilation and curettage.  Benefits and risks discussed including but not limited to bleeding, infection, damage to surrounding organs, uterine perforation and subsequent laparoscopy and hospital observation, fluid overload and pulmonary edema with hyponatremia, recurrence of endometrial polyps.  Patient wishes to proceed.  ACOG handouts on the above surgical procedures to patient.   25 minutes face to face time of which over 50% was spent in counseling.   After visit summary to patient.

## 2013-06-01 ENCOUNTER — Telehealth: Payer: Self-pay | Admitting: Obstetrics and Gynecology

## 2013-06-01 NOTE — Telephone Encounter (Signed)
Left message for patient to call back. Need to go over surgery benefits. °

## 2013-06-01 NOTE — Telephone Encounter (Signed)
Returning a call to Sabrina. °

## 2013-06-02 NOTE — Telephone Encounter (Signed)
Spoke with patient. Advised that per benefit quote received, she will be responsible for $563.27 for surgeons fees. Advised that once the surgery is scheduled, payment will be due to the office in full at least 2 weeks prior to the scheduled date. Patient agreeable. Hoping to have surgery performed 06/16 or 06/23  (made no guarantees regarding date). [facility]

## 2013-06-09 NOTE — Telephone Encounter (Signed)
Patient calling asking for Kennon Rounds to return her call. The patient is wanting to schedule her surgery so her husband can request time off from work. The patient is aware Kennon Rounds is off today.

## 2013-06-10 NOTE — Telephone Encounter (Signed)
MAILED LETTER TO PATIENT::  Jun 10, 2013   Dear Ms. Dana Tucker,  Your surgery is scheduled for July 05, 2013.  Upon requesting authorization for your surgery, your insurance company has informed us that they will cover 60% of the charges after a $3500 deductible, and you will be responsible to pay approximately $563.27.  It is our office policy that this amount is paid in full two weeks prior to your surgery. Your payment is due on June 09.  If there is a balance due after your insurance company pays their portion, we will send you a bill.  If there is a refund due to you, we will send you a check within one month.  Payment may be made by cash, check, Visa, Environmental education officer. Payment can be made in the office or over the telephone.  If payment is not made two weeks prior to your surgery, we will have to reschedule your surgery.  The above fee includes only our fee for the surgery and does not include charges you may have from the facility, anesthesia or pathology.  If you have any questions, please call us at [phone].

## 2013-06-10 NOTE — Telephone Encounter (Signed)
Dr Edward Jolly, can this recheck be done at preop?

## 2013-06-10 NOTE — Telephone Encounter (Signed)
Noted, appointment with Debbi canceled.

## 2013-06-10 NOTE — Telephone Encounter (Signed)
Thank you for the update!

## 2013-06-10 NOTE — Telephone Encounter (Signed)
I can do a recheck with the patient regarding her estrogen cream at the same time as the preop visit.

## 2013-06-10 NOTE — Telephone Encounter (Signed)
Surgery scheduled for 07-05-13 at 1130. Surgery instruction sheet reviewed and mailed.  Patient has 3 month recheck (with Debbi) after beginning estrogen cream scheduled for same day as preop. Can she have this rechecked at preop?

## 2013-06-14 ENCOUNTER — Ambulatory Visit: Payer: BC Managed Care – PPO | Admitting: Certified Nurse Midwife

## 2013-06-20 ENCOUNTER — Encounter: Payer: Self-pay | Admitting: Obstetrics and Gynecology

## 2013-06-20 ENCOUNTER — Institutional Professional Consult (permissible substitution): Payer: BC Managed Care – PPO | Admitting: Obstetrics and Gynecology

## 2013-06-20 ENCOUNTER — Ambulatory Visit: Payer: BC Managed Care – PPO | Admitting: Certified Nurse Midwife

## 2013-06-20 ENCOUNTER — Ambulatory Visit (INDEPENDENT_AMBULATORY_CARE_PROVIDER_SITE_OTHER): Payer: BC Managed Care – PPO | Admitting: Obstetrics and Gynecology

## 2013-06-20 VITALS — BP 118/75 | HR 60 | Ht 64.75 in | Wt 121.5 lb

## 2013-06-20 DIAGNOSIS — N9489 Other specified conditions associated with female genital organs and menstrual cycle: Secondary | ICD-10-CM

## 2013-06-20 DIAGNOSIS — N950 Postmenopausal bleeding: Secondary | ICD-10-CM

## 2013-06-20 NOTE — Progress Notes (Signed)
Patient ID: Dana Tucker, female   DOB: [birth year], 53 y.o.   MRN: [id number] GYNECOLOGY VISIT  PCP: Tally Joe, MD  Referring provider:   HPI: 53 y.o.   Married  Caucasian  female   G2P2002 with Patient's last menstrual period was 05/18/2011.   here to discuss surgery.   Patient presented with postmenopausal bleeding following intercourse.  Has not had any further bleeding.   Pelvic ultrasound showing endometrial mass 4.04 mm and sonohysterogram showing 7 mm filling defect. Myometrium normal  Normal ovaries.  EMB benign.  Using Vagifem.  Has mitral valve prolapse.  Occasional palpitations.  Does not take antibiotics prior to procedures.  Did have an ECHO in 2006.  GYNECOLOGIC HISTORY: Patient's last menstrual period was 05/18/2011. Sexually active:  yes Partner preference: female Contraception: postmenopausal  Menopausal hormone therapy: Vagifem DES exposure: no   Blood transfusions: no   Sexually transmitted diseases: no GYN procedures and prior surgeries: no  Last mammogram:  08/2012 wnl:[facility]               Last pap and high risk HPV testing:  05-05-12 wnl:neg HR HPV.  History of abnormal pap smear:  no   OB History   Grav Para Term Preterm Abortions TAB SAB Ect Mult Living   2 2 2       2        LIFESTYLE: Exercise:   walking            Tobacco:   no Alcohol:    rarely Drug use:  no  Patient Active Problem List   Diagnosis Date Noted  . Postmenopausal bleeding 05/04/2013  . Atrophic vaginitis 02/03/2013    Class: Acute    Past Medical History  Diagnosis Date  . MVP (mitral valve prolapse)   . Fibrocystic breast   . Acid reflux   . Colon polyps     History reviewed. No pertinent past surgical history.  Current Outpatient Prescriptions  Medication Sig Dispense Refill  . atorvastatin (LIPITOR) 20 MG tablet daily. Take 1/2      . Estradiol (VAGIFEM) 10 MCG TABS vaginal tablet Insert one tablet nightly x 2 weeks, then one twice weekly   18 tablet  12  . Inulin (FIBERCHOICE PO) Take by mouth daily. With calcium & vitamin d      . KRILL OIL PO Take by mouth daily.      . Multiple Vitamins-Minerals (MULTIVITAMIN PO) Take by mouth daily.      . Probiotic Product (PHILLIPS COLON HEALTH PO) Take by mouth. occ       No current facility-administered medications for this visit.     ALLERGIES: Sulfa antibiotics  Family History  Problem Relation Age of Onset  . Hyperlipidemia Mother   . Hyperlipidemia Father   . Cancer Maternal Grandfather     colon  . Diabetes Maternal Grandfather   . Breast cancer Paternal Grandmother     History   Social History  . Marital Status: Married    Spouse Name: N/A    Number of Children: N/A  . Years of Education: N/A   Occupational History  . Not on file.   Social History Main Topics  . Smoking status: Never Smoker   . Smokeless tobacco: Never Used  . Alcohol Use: 0.5 oz/week    1 drink(s) per week     Comment: occ glass of wine  . Drug Use: No  . Sexual Activity: Yes    Partners: Male  Birth Control/ Protection: Condom, None, Post-menopausal   Other Topics Concern  . Not on file   Social History Narrative  . No narrative on file    ROS:  Pertinent items are noted in HPI.  PHYSICAL EXAMINATION:    BP 118/75  Pulse 60  Ht 5' 4.75" (1.645 m)  Wt 121 lb 8 oz (55.112 kg)  BMI 20.37 kg/m2  LMP 05/18/2011   Wt Readings from Last 3 Encounters:  06/20/13 121 lb 8 oz (55.112 kg)  05/04/13 122 lb 9.6 oz (55.611 kg)  03/07/13 128 lb (58.06 kg)     Ht Readings from Last 3 Encounters:  06/20/13 5' 4.75" (1.645 m)  05/04/13 5' 4.75" (1.645 m)  03/07/13 5' 4.75" (1.645 m)    General appearance: alert, cooperative and appears stated age Head: Normocephalic, without obvious abnormality, atraumatic Neck: no adenopathy, supple, symmetrical, trachea midline and thyroid not enlarged, symmetric, no tenderness/mass/nodules Lungs: clear to auscultation bilaterally Heart:  regular rate and rhythm Abdomen: soft, non-tender; no masses,  no organomegaly Extremities: extremities normal, atraumatic, no cyanosis or edema Skin: Skin color, texture, turgor normal. No rashes or lesions Lymph nodes: Cervical, supraclavicular, and axillary nodes normal. No abnormal inguinal nodes palpated Neurologic: Grossly normal  Pelvic: External genitalia:  no lesions              Urethra:  normal appearing urethra with no masses, tenderness or lesions              Bartholins and Skenes: normal                 Vagina: normal appearing vagina with normal color and discharge, no lesions              Cervix: normal appearance                 Bimanual Exam:  Uterus:  uterus is normal size, shape, consistency and nontender                                      Adnexa: normal adnexa in size, nontender and no masses                                  ASSESSMENT  Postmenopausal bleeding.  Suspected polyp.  Endometrial mass noted on pelvic ultrasound and sonohysterogram.   Negative EMB. Vagifem patient.   PLAN  Hysteroscopic polypectomy with dilation and curettage.  Risks, benefits, and alternatives reviewed with the patient who wishes to proceed.   An After Visit Summary was printed and given to the patient.  15 minutes face to face time of which over 505 was spent in counseling.

## 2013-06-27 ENCOUNTER — Encounter (HOSPITAL_COMMUNITY): Payer: Self-pay | Admitting: Pharmacist

## 2013-06-29 ENCOUNTER — Encounter (HOSPITAL_COMMUNITY): Payer: Self-pay | Admitting: *Deleted

## 2013-07-04 ENCOUNTER — Encounter (HOSPITAL_COMMUNITY): Payer: Self-pay | Admitting: Anesthesiology

## 2013-07-04 NOTE — H&P (Signed)
Brook E Amundson de Gwenevere Ghaziarvalho E Silva, MD at 06/20/2013  1:29 PM    Status: Signed             Patient ID: Dana Tucker, female   DOB: 09/25/1960, 53 y.o.   MRN: [id number] GYNECOLOGY VISIT   PCP: Tally Joeavid Swayne, MD   Referring provider:    HPI: 53 y.o.   Married  Caucasian  female    G2P2002 with Patient's last menstrual period was 05/18/2011.    here to discuss surgery.    Patient presented with postmenopausal bleeding following intercourse.   Has not had any further bleeding.    Pelvic ultrasound showing endometrial mass 4.04 mm and sonohysterogram showing 7 mm filling defect. Myometrium normal  Normal ovaries.   EMB benign.   Using Vagifem.   Has mitral valve prolapse.   Occasional palpitations.   Does not take antibiotics prior to procedures.   Did have an ECHO in 2006.   GYNECOLOGIC HISTORY: Patient's last menstrual period was 05/18/2011. Sexually active:  yes Partner preference: female Contraception: postmenopausal   Menopausal hormone therapy: Vagifem DES exposure: no    Blood transfusions: no    Sexually transmitted diseases: no GYN procedures and prior surgeries: no   Last mammogram:  08/2012 wnl:[facility]                Last pap and high risk HPV testing:  05-05-12 wnl:neg HR HPV.   History of abnormal pap smear:  no    OB History     Grav  Para  Term  Preterm  Abortions  TAB  SAB  Ect  Mult  Living     2  2  2              2            LIFESTYLE: Exercise:   walking             Tobacco:   no Alcohol:    rarely Drug use:  no    Patient Active Problem List     Diagnosis  Date Noted   .  Postmenopausal bleeding  05/04/2013   .  Atrophic vaginitis  02/03/2013       Class: Acute         Past Medical History   Diagnosis  Date   .  MVP (mitral valve prolapse)     .  Fibrocystic breast     .  Acid reflux     .  Colon polyps          History reviewed. No pertinent past surgical history.    Current Outpatient Prescriptions    Medication  Sig  Dispense  Refill   .  atorvastatin (LIPITOR) 20 MG tablet  daily. Take 1/2         .  Estradiol (VAGIFEM) 10 MCG TABS vaginal tablet  Insert one tablet nightly x 2 weeks, then one twice weekly   18 tablet   12   .  Inulin (FIBERCHOICE PO)  Take by mouth daily. With calcium & vitamin d         .  KRILL OIL PO  Take by mouth daily.         .  Multiple Vitamins-Minerals (MULTIVITAMIN PO)  Take by mouth daily.         .  Probiotic Product (PHILLIPS COLON HEALTH PO)  Take by mouth. occ  No current facility-administered medications for this visit.        ALLERGIES: Sulfa antibiotics    Family History   Problem  Relation  Age of Onset   .  Hyperlipidemia  Mother     .  Hyperlipidemia  Father     .  Cancer  Maternal Grandfather         colon   .  Diabetes  Maternal Grandfather     .  Breast cancer  Paternal Grandmother           History       Social History   .  Marital Status:  Married       Spouse Name:  N/A       Number of Children:  N/A   .  Years of Education:  N/A       Occupational History   .  Not on file.       Social History Main Topics   .  Smoking status:  Never Smoker    .  Smokeless tobacco:  Never Used   .  Alcohol Use:  0.5 oz/week       1 drink(s) per week         Comment: occ glass of wine   .  Drug Use:  No   .  Sexual Activity:  Yes       Partners:  Male       Birth Control/ Protection:  Condom, None, Post-menopausal       Other Topics  Concern   .  Not on file       Social History Narrative   .  No narrative on file        ROS:  Pertinent items are noted in HPI.   PHYSICAL EXAMINATION:     BP 118/75  Pulse 60  Ht 5' 4.75" (1.645 m)  Wt 121 lb 8 oz (55.112 kg)  BMI 20.37 kg/m2  LMP 05/18/2011    Wt Readings from Last 3 Encounters:   06/20/13  121 lb 8 oz (55.112 kg)   05/04/13  122 lb 9.6 oz (55.611 kg)   03/07/13  128 lb (58.06 kg)        Ht Readings from Last 3 Encounters:   06/20/13  5'  4.75" (1.645 m)   05/04/13  5' 4.75" (1.645 m)   03/07/13  5' 4.75" (1.645 m)        General appearance: alert, cooperative and appears stated age Head: Normocephalic, without obvious abnormality, atraumatic Neck: no adenopathy, supple, symmetrical, trachea midline and thyroid not enlarged, symmetric, no tenderness/mass/nodules Lungs: clear to auscultation bilaterally Heart: regular rate and rhythm Abdomen: soft, non-tender; no masses,  no organomegaly Extremities: extremities normal, atraumatic, no cyanosis or edema Skin: Skin color, texture, turgor normal. No rashes or lesions Lymph nodes: Cervical, supraclavicular, and axillary nodes normal. No abnormal inguinal nodes palpated Neurologic: Grossly normal   Pelvic: External genitalia:  no lesions              Urethra:  normal appearing urethra with no masses, tenderness or lesions              Bartholins and Skenes: normal                  Vagina: normal appearing vagina with normal color and discharge, no lesions              Cervix: normal appearance  Bimanual Exam:  Uterus:  uterus is normal size, shape, consistency and nontender                                      Adnexa: normal adnexa in size, nontender and no masses                                   ASSESSMENT   Postmenopausal bleeding.   Suspected polyp.  Endometrial mass noted on pelvic ultrasound and sonohysterogram.   Negative EMB. Vagifem patient.    PLAN   Hysteroscopic polypectomy with dilation and curettage.  Risks, benefits, and alternatives reviewed with the patient who wishes to proceed.    An After Visit Summary was printed and given to the patient.   15 minutes face to face time of which over 50% was spent in counseling.

## 2013-07-05 ENCOUNTER — Encounter (HOSPITAL_COMMUNITY): Payer: BC Managed Care – PPO | Admitting: Anesthesiology

## 2013-07-05 ENCOUNTER — Ambulatory Visit (HOSPITAL_COMMUNITY): Payer: BC Managed Care – PPO | Admitting: Anesthesiology

## 2013-07-05 ENCOUNTER — Encounter (HOSPITAL_COMMUNITY): Payer: Self-pay

## 2013-07-05 ENCOUNTER — Ambulatory Visit (HOSPITAL_COMMUNITY)
Admission: RE | Admit: 2013-07-05 | Discharge: 2013-07-05 | Disposition: A | Discharge status: Home / Self Care | Payer: BC Managed Care – PPO | Source: Ambulatory Visit | Attending: Obstetrics and Gynecology | Admitting: Obstetrics and Gynecology

## 2013-07-05 ENCOUNTER — Encounter (HOSPITAL_COMMUNITY)
Admission: RE | Disposition: A | Discharge status: Home / Self Care | Payer: Self-pay | Source: Ambulatory Visit | Attending: Obstetrics and Gynecology

## 2013-07-05 DIAGNOSIS — N950 Postmenopausal bleeding: Secondary | ICD-10-CM

## 2013-07-05 DIAGNOSIS — N840 Polyp of corpus uteri: Secondary | ICD-10-CM

## 2013-07-05 DIAGNOSIS — Z8249 Family history of ischemic heart disease and other diseases of the circulatory system: Secondary | ICD-10-CM | POA: Insufficient documentation

## 2013-07-05 DIAGNOSIS — Z833 Family history of diabetes mellitus: Secondary | ICD-10-CM | POA: Insufficient documentation

## 2013-07-05 DIAGNOSIS — Z800 Family history of malignant neoplasm of digestive organs: Secondary | ICD-10-CM | POA: Insufficient documentation

## 2013-07-05 DIAGNOSIS — K219 Gastro-esophageal reflux disease without esophagitis: Secondary | ICD-10-CM | POA: Insufficient documentation

## 2013-07-05 DIAGNOSIS — R002 Palpitations: Secondary | ICD-10-CM | POA: Insufficient documentation

## 2013-07-05 DIAGNOSIS — Z803 Family history of malignant neoplasm of breast: Secondary | ICD-10-CM | POA: Insufficient documentation

## 2013-07-05 DIAGNOSIS — I059 Rheumatic mitral valve disease, unspecified: Secondary | ICD-10-CM | POA: Insufficient documentation

## 2013-07-05 HISTORY — PX: DILATATION & CURRETTAGE/HYSTEROSCOPY WITH RESECTOCOPE: SHX5572

## 2013-07-05 LAB — CBC
HEMATOCRIT: 41.3 % (ref 36.0–46.0)
HEMOGLOBIN: 14 g/dL (ref 12.0–15.0)
MCH: 31.2 pg (ref 26.0–34.0)
MCHC: 33.9 g/dL (ref 30.0–36.0)
MCV: 92 fL (ref 78.0–100.0)
Platelets: 268 10*3/uL (ref 150–400)
RBC: 4.49 MIL/uL (ref 3.87–5.11)
RDW: 12.6 % (ref 11.5–15.5)
WBC: 6.1 10*3/uL (ref 4.0–10.5)

## 2013-07-05 SURGERY — DILATATION & CURETTAGE/HYSTEROSCOPY WITH RESECTOCOPE
Anesthesia: General | Site: Uterus

## 2013-07-05 MED ORDER — MEPERIDINE HCL 25 MG/ML IJ SOLN: 6.2500 mg | INTRAMUSCULAR | Status: DC | PRN

## 2013-07-05 MED ORDER — PHENYLEPHRINE HCL 10 MG/ML IJ SOLN
INTRAMUSCULAR | Status: DC | PRN
  Administered 2013-07-05: 40 ug via INTRAVENOUS

## 2013-07-05 MED ORDER — FENTANYL CITRATE 0.05 MG/ML IJ SOLN
INTRAMUSCULAR | Status: AC
  Filled 2013-07-05: qty 2

## 2013-07-05 MED ORDER — MIDAZOLAM HCL 2 MG/2ML IJ SOLN
INTRAMUSCULAR | Status: DC | PRN
  Administered 2013-07-05: 2 mg via INTRAVENOUS

## 2013-07-05 MED ORDER — PANTOPRAZOLE SODIUM 40 MG PO TBEC
DELAYED_RELEASE_TABLET | ORAL | Status: AC
  Administered 2013-07-05: 40 mg
  Filled 2013-07-05: qty 1

## 2013-07-05 MED ORDER — METOCLOPRAMIDE HCL 5 MG/ML IJ SOLN: 10.0000 mg | Freq: Once | INTRAMUSCULAR | Status: DC | PRN

## 2013-07-05 MED ORDER — LIDOCAINE HCL (CARDIAC) 20 MG/ML IV SOLN
INTRAVENOUS | Status: DC | PRN
  Administered 2013-07-05: 70 mg via INTRAVENOUS

## 2013-07-05 MED ORDER — DEXAMETHASONE SODIUM PHOSPHATE 10 MG/ML IJ SOLN
INTRAMUSCULAR | Status: AC
  Filled 2013-07-05: qty 1

## 2013-07-05 MED ORDER — IBUPROFEN 800 MG PO TABS: 800.0000 mg | ORAL_TABLET | Freq: Three times a day (TID) | ORAL | Status: DC | PRN

## 2013-07-05 MED ORDER — LIDOCAINE HCL 1 % IJ SOLN
INTRAMUSCULAR | Status: AC
  Filled 2013-07-05: qty 20

## 2013-07-05 MED ORDER — KETOROLAC TROMETHAMINE 30 MG/ML IJ SOLN
INTRAMUSCULAR | Status: DC | PRN
  Administered 2013-07-05: 30 mg via INTRAVENOUS

## 2013-07-05 MED ORDER — EPHEDRINE SULFATE 50 MG/ML IJ SOLN
INTRAMUSCULAR | Status: AC
  Filled 2013-07-05: qty 1

## 2013-07-05 MED ORDER — KETOROLAC TROMETHAMINE 30 MG/ML IJ SOLN
INTRAMUSCULAR | Status: AC
  Filled 2013-07-05: qty 1

## 2013-07-05 MED ORDER — KETOROLAC TROMETHAMINE 60 MG/2ML IM SOLN
INTRAMUSCULAR | Status: DC | PRN
  Administered 2013-07-05: 30 mg via INTRAMUSCULAR

## 2013-07-05 MED ORDER — PROPOFOL 10 MG/ML IV EMUL
INTRAVENOUS | Status: AC
  Filled 2013-07-05: qty 20

## 2013-07-05 MED ORDER — GLYCINE 1.5 % IR SOLN
Status: DC | PRN
  Administered 2013-07-05: 3000 mL

## 2013-07-05 MED ORDER — LACTATED RINGERS IV SOLN
INTRAVENOUS | Status: DC
  Administered 2013-07-05: 10:00:00 via INTRAVENOUS

## 2013-07-05 MED ORDER — LIDOCAINE HCL (CARDIAC) 20 MG/ML IV SOLN
INTRAVENOUS | Status: AC
  Filled 2013-07-05: qty 5

## 2013-07-05 MED ORDER — LACTATED RINGERS IV SOLN
INTRAVENOUS | Status: DC
  Administered 2013-07-05 (×2): via INTRAVENOUS

## 2013-07-05 MED ORDER — DEXAMETHASONE SODIUM PHOSPHATE 4 MG/ML IJ SOLN
INTRAMUSCULAR | Status: DC | PRN
  Administered 2013-07-05: 10 mg via INTRAVENOUS

## 2013-07-05 MED ORDER — PROPOFOL INFUSION 10 MG/ML OPTIME
INTRAVENOUS | Status: DC | PRN
  Administered 2013-07-05: 180 mL via INTRAVENOUS

## 2013-07-05 MED ORDER — KETOROLAC TROMETHAMINE 30 MG/ML IJ SOLN
INTRAMUSCULAR | Status: AC
  Filled 2013-07-05: qty 2

## 2013-07-05 MED ORDER — FENTANYL CITRATE 0.05 MG/ML IJ SOLN
INTRAMUSCULAR | Status: DC | PRN
  Administered 2013-07-05: 100 ug via INTRAVENOUS

## 2013-07-05 MED ORDER — EPHEDRINE SULFATE 50 MG/ML IJ SOLN
INTRAMUSCULAR | Status: DC | PRN
  Administered 2013-07-05: 10 mg via INTRAVENOUS
  Administered 2013-07-05: 5 mg via INTRAVENOUS

## 2013-07-05 MED ORDER — MIDAZOLAM HCL 2 MG/2ML IJ SOLN
INTRAMUSCULAR | Status: AC
  Filled 2013-07-05: qty 2

## 2013-07-05 MED ORDER — LIDOCAINE HCL 1 % IJ SOLN
INTRAMUSCULAR | Status: DC | PRN
  Administered 2013-07-05: 10 mL

## 2013-07-05 MED ORDER — FENTANYL CITRATE 0.05 MG/ML IJ SOLN: 25.0000 ug | INTRAMUSCULAR | Status: DC | PRN

## 2013-07-05 MED ORDER — KETOROLAC TROMETHAMINE 30 MG/ML IJ SOLN: 15.0000 mg | Freq: Once | INTRAMUSCULAR | Status: DC | PRN

## 2013-07-05 MED ORDER — ONDANSETRON HCL 4 MG/2ML IJ SOLN
INTRAMUSCULAR | Status: AC
  Filled 2013-07-05: qty 2

## 2013-07-05 MED ORDER — ONDANSETRON HCL 4 MG/2ML IJ SOLN
INTRAMUSCULAR | Status: DC | PRN
  Administered 2013-07-05: 4 mg via INTRAVENOUS

## 2013-07-05 SURGICAL SUPPLY — 21 items
CANISTER SUCT 3000ML (MISCELLANEOUS) ×2 IMPLANT
CATH ROBINSON RED A/P 16FR (CATHETERS) ×2 IMPLANT
CLOTH BEACON ORANGE TIMEOUT ST (SAFETY) ×2 IMPLANT
CONTAINER PREFILL 10% NBF 60ML (FORM) ×4 IMPLANT
DRAPE HYSTEROSCOPY (DRAPE) ×2 IMPLANT
DRSG TELFA 3X8 NADH (GAUZE/BANDAGES/DRESSINGS) ×2 IMPLANT
ELECT REM PT RETURN 9FT ADLT (ELECTROSURGICAL) ×2
ELECTRODE REM PT RTRN 9FT ADLT (ELECTROSURGICAL) IMPLANT
GLOVE BIO SURGEON STRL SZ 6.5 (GLOVE) ×2 IMPLANT
GLOVE BIOGEL PI IND STRL 7.0 (GLOVE) ×1 IMPLANT
GLOVE BIOGEL PI INDICATOR 7.0 (GLOVE) ×5
GLOVE SURG SS PI 7.0 STRL IVOR (GLOVE) ×4 IMPLANT
GOWN STRL REUS W/TWL LRG LVL3 (GOWN DISPOSABLE) ×4 IMPLANT
LOOP ANGLED CUTTING 22FR (CUTTING LOOP) ×2 IMPLANT
PACK VAGINAL MINOR WOMEN LF (CUSTOM PROCEDURE TRAY) ×2 IMPLANT
PAD DRESSING TELFA 3X8 NADH (GAUZE/BANDAGES/DRESSINGS) ×1 IMPLANT
PAD OB MATERNITY 4.3X12.25 (PERSONAL CARE ITEMS) ×2 IMPLANT
SET TUBING HYSTEROSCOPY 2 NDL (TUBING) ×1 IMPLANT
TOWEL OR 17X24 6PK STRL BLUE (TOWEL DISPOSABLE) ×4 IMPLANT
TUBE HYSTEROSCOPY W Y-CONNECT (TUBING) ×1 IMPLANT
WATER STERILE IRR 1000ML POUR (IV SOLUTION) ×2 IMPLANT

## 2013-07-05 NOTE — Transfer of Care (Signed)
Immediate Anesthesia Transfer of Care Note  Patient: Dana Tucker  Procedure(s) Performed: Procedure(s): DILATATION & CURETTAGE/HYSTEROSCOPY WITH RESECTOCOPE of polyp (N/A)  Patient Location: PACU  Anesthesia Type:General  Level of Consciousness: awake, alert  and oriented  Airway & Oxygen Therapy: Patient Spontanous Breathing and Patient connected to nasal cannula oxygen  Post-op Assessment: Report given to PACU RN and Post -op Vital signs reviewed and stable  Post vital signs: Reviewed and stable  Complications: No apparent anesthesia complications

## 2013-07-05 NOTE — Progress Notes (Signed)
Update to History and Physical  No marked change in status since office pre-op visit.   Patient examined.   OK to proceed with surgery. 

## 2013-07-05 NOTE — Discharge Instructions (Signed)
Hysteroscopy, Care After °Refer to this sheet in the next few weeks. These instructions provide you with information on caring for yourself after your procedure. Your health care provider may also give you more specific instructions. Your treatment has been planned according to current medical practices, but problems sometimes occur. Call your health care provider if you have any problems or questions after your procedure.  °WHAT TO EXPECT AFTER THE PROCEDURE °After your procedure, it is typical to have the following: °· You may have some cramping. This normally lasts for a couple days. °· You may have bleeding. This can vary from light spotting for a few days to menstrual-like bleeding for 3-7 days. °HOME CARE INSTRUCTIONS °· Rest for the first 1-2 days after the procedure. °· Only take over-the-counter or prescription medicines as directed by your health care provider. Do not take aspirin. It can increase the chances of bleeding. °· Take showers instead of baths for 2 weeks or as directed by your health care provider. °· Do not drive for 24 hours or as directed. °· Do not drink alcohol while taking pain medicine. °· Do not use tampons, douche, or have sexual intercourse for 2 weeks or until your health care provider says it is okay. °· Take your temperature twice a day for 4-5 days. Write it down each time. °· Follow your health care provider's advice about diet, exercise, and lifting. °· If you develop constipation, you may: °¨ Take a mild laxative if your health care provider approves. °¨ Add bran foods to your diet. °¨ Drink enough fluids to keep your urine clear or pale yellow. °· Try to have someone with you or available to you for the first 24-48 hours, especially if you were given a general anesthetic. °· Follow up with your health care provider as directed. °SEEK MEDICAL CARE IF: °· You feel dizzy or lightheaded. °· You feel sick to your stomach (nauseous). °· You have abnormal vaginal discharge. °· You  have a rash. °· You have pain that is not controlled with medicine. °SEEK IMMEDIATE MEDICAL CARE IF: °· You have bleeding that is heavier than a normal menstrual period. °· You have a fever. °· You have increasing cramps or pain, not controlled with medicine. °· You have new belly (abdominal) pain. °· You pass out. °· You have pain in the tops of your shoulders (shoulder strap areas). °· You have shortness of breath. °Document Released: 10/20/2012 Document Reviewed: 10/20/2012 °Exit[employer]® Patient Information ©2015 [employer]. This information is not intended to replace advice given to you by your health care provider. Make sure you discuss any questions you have with your health care provider. ° °

## 2013-07-05 NOTE — Brief Op Note (Signed)
07/05/2013  2:12 PM  PATIENT:  Arvella Nighynthia S Curtiss  53 y.o. female  PRE-OPERATIVE DIAGNOSIS:  Post Menopausal Bleeding, endometrial mass  POST-OPERATIVE DIAGNOSIS:  Post Menopausal Bleeding, endometrial polyp  PROCEDURE:  Procedure(s): DILATATION & CURETTAGE/HYSTEROSCOPY WITH RESECTOCOPE of polyp (N/A)  SURGEON:  Surgeon(s) and Role:    * Brook E Amundson de Gwenevere Ghaziarvalho E Silva, MD - Primary  PHYSICIAN ASSISTANT:   ASSISTANTS: none   ANESTHESIA:   paracervical block and MAC  EBL:  Total I/O In: 1400 [I.V.:1400] Out: 230 [Urine:225; Blood:5]  BLOOD ADMINISTERED:none  DRAINS: none   LOCAL MEDICATIONS USED:  LIDOCAINE  and Amount: 10 ml  SPECIMEN:  Source of Specimen:   endometrial polyp and endometrial curettings  DISPOSITION OF SPECIMEN:  PATHOLOGY  COUNTS:  YES  TOURNIQUET:  * No tourniquets in log *  DICTATION: .Other Dictation: Dictation Number    PLAN OF CARE: Discharge to home after PACU  PATIENT DISPOSITION:  PACU - hemodynamically stable.   Delay start of Pharmacological VTE agent (>24hrs) due to surgical blood loss or risk of bleeding: not applicable

## 2013-07-05 NOTE — Anesthesia Preprocedure Evaluation (Addendum)
Anesthesia Evaluation  Patient identified by MRN, date of birth, ID band Patient awake    Reviewed: Allergy & Precautions, H&P , NPO status , Patient's Chart, lab work & pertinent test results, reviewed documented beta blocker date and time   History of Anesthesia Complications Negative for: history of anesthetic complications  Airway Mallampati: II TM Distance: >3 FB Neck ROM: full    Dental  (+) Teeth Intact   Pulmonary neg pulmonary ROS,  breath sounds clear to auscultation  Pulmonary exam normal       Cardiovascular Exercise Tolerance: Good + Valvular Problems/Murmurs MVP Rhythm:regular Rate:Normal     Neuro/Psych negative neurological ROS  negative psych ROS   GI/Hepatic Neg liver ROS, GERD-  Medicated,  Endo/Other  negative endocrine ROS  Renal/GU negative Renal ROS  Female GU complaint     Musculoskeletal   Abdominal   Peds  Hematology negative hematology ROS (+)   Anesthesia Other Findings   Reproductive/Obstetrics negative OB ROS                          Anesthesia Physical Anesthesia Plan  ASA: II  Anesthesia Plan: General LMA   Post-op Pain Management:    Induction:   Airway Management Planned:   Additional Equipment:   Intra-op Plan:   Post-operative Plan:   Informed Consent: I have reviewed the patients History and Physical, chart, labs and discussed the procedure including the risks, benefits and alternatives for the proposed anesthesia with the patient or authorized representative who has indicated his/her understanding and acceptance.   Dental Advisory Given  Plan Discussed with: CRNA and Surgeon  Anesthesia Plan Comments:         Anesthesia Quick Evaluation

## 2013-07-05 NOTE — Anesthesia Postprocedure Evaluation (Signed)
  Anesthesia Post Note  Patient: Dana NighCynthia S Dhillon  Procedure(s) Performed: Procedure(s) (LRB): DILATATION & CURETTAGE/HYSTEROSCOPY WITH RESECTOCOPE of polyp (N/A)  Anesthesia type: GA  Patient location: PACU  Post pain: Pain level controlled  Post assessment: Post-op Vital signs reviewed  Last Vitals:  Filed Vitals:   07/05/13 1430  BP: 112/51  Pulse: 82  Temp:   Resp: 22    Post vital signs: Reviewed  Level of consciousness: sedated  Complications: No apparent anesthesia complications

## 2013-07-06 ENCOUNTER — Encounter (HOSPITAL_COMMUNITY): Payer: Self-pay | Admitting: Obstetrics and Gynecology

## 2013-07-06 NOTE — Op Note (Signed)
[place]:  Dana Tucker, Dana Tucker            ACCOUNT NO.:  [id number]  MEDICAL RECORD NO.:  [id number]  LOCATION:  [facility]                          FACILITY:  [facility]  PHYSICIAN:  Randye LoboBrook E. Silva, M.D.   DATE OF BIRTH:  1960-10-16  DATE OF PROCEDURE:  07/05/2013 DATE OF DISCHARGE:  07/05/2013                              OPERATIVE REPORT   PREOPERATIVE DIAGNOSES:  Postmenopausal bleeding, endometrial mass.  POSTOPERATIVE DIAGNOSES:  Postmenopausal bleeding, endometrial polyp.  PROCEDURE:  Hysteroscopic polypectomy, dilation and curettage.  SURGEON:  Randye LoboBrook E. Silva, M.D.  ANESTHESIA:  MAC, paracervical block with 10 mL of 1% lidocaine.  ESTIMATED BLOOD LOSS:  5 mL.  IV FLUIDS:  1400 mL Ringer's lactate, glycine deficit 150 mL.  URINE OUTPUT:  225 mL.  COMPLICATIONS:  None.  INDICATIONS FOR PROCEDURE:  The patient is a 53 year old, gravida 2, para 2-0-0-2 Caucasian female, who presented with postmenopausal bleeding following intercourse.  The patient had a pelvic ultrasound documenting an endometrial mass on sonohysterography demonstrating a 7 mm defect.  The myometrium appeared to be normal, as were the ovaries. An endometrial biopsy was benign.  The patient has been taking Vagifem tablets vaginally for atrophic vaginitis symptoms.  The plan is now made to proceed with a hysteroscopic polypectomy with dilation and curettage after risks, benefits, and alternatives are reviewed.  FINDINGS:  Examination under anesthesia revealed a small anteverted mobile uterus.  No adnexal masses were appreciated.  The uterus was sounded to 6 cm.  There was a 0.5 cm polyp noted in the posterior right cornual region.  There was no evidence of any intrauterine fibroids.  The tubal ostial regions were visualized and were unremarkable.  There was no evidence of any polyp formation in the cervix.  SPECIMENS:  The endometrial polyp was sent to pathology separately from the endometrial curettings.  DESCRIPTION  OF PROCEDURE:  The patient was reidentified in the preop hold area.  The patient received TED hose and PAS stockings for DVT prophylaxis.  In the operating room, the patient was placed in the supine position on the operating room table.  MAC anesthesia was induced and the patient was then placed in the dorsal lithotomy position.  The lower abdomen, vagina, and perineum were then sterilely prepped and draped and the patient was catheterized with a red rubber catheter.  The patient was then sterilely draped.  An exam under anesthesia was performed.  A speculum was placed inside the vagina.  The paracervical block was performed with a total of 10 mL of 1% lidocaine plain.  A tenaculum was placed on the anterior cervical lip.  The uterus was sounded to 6 cm.  The cervix was then dilated to a #19 Pratt dilator.  The diagnostic hysteroscope was then inserted into the uterine cavity under the continuous infusion of glycine.  The findings are as noted above.  The diagnostic hysteroscope was removed and the cervix was further dilated to a #27 Pratt dilator.  The resectoscope was then inserted into the uterine cavity under the continuous infusion of glycine.  The loop was used to resect the base of the polyp using a setting of 60 watts.  This was then removed from the endometrial cavity  and was set aside, and later sent to pathology.  The resectoscope was introduced again and this confirmed the complete removal of the endometrial polyp.  The resectoscope was therefore removed.  A combination of sharp and blunt curettage was performed in all 4 quadrants of the endometrium.  There was a very scant amount of tissue and this was sent to pathology separately from the endometrial polyp.  The tenaculum was removed from the anterior cervical lip.  There was a slight amount of oozing and this responded to compression with a ring forceps.  Hemostasis was then excellent.  All of the vaginal  instruments were removed.  The patient was placed, cleansed of any remaining Betadine.  She was awakened and escorted to the recovery room in stable condition.  There were no complications.  All needle, instrument, and sponge counts were correct.     Randye LoboBrook E. Silva, M.D.     BES/MEDQ  D:  07/05/2013  T:  07/06/2013  Job:  [id number]

## 2013-07-08 ENCOUNTER — Encounter: Payer: Self-pay | Admitting: Obstetrics and Gynecology

## 2013-07-21 ENCOUNTER — Ambulatory Visit (INDEPENDENT_AMBULATORY_CARE_PROVIDER_SITE_OTHER): Payer: BC Managed Care – PPO | Admitting: Obstetrics and Gynecology

## 2013-07-21 ENCOUNTER — Encounter: Payer: Self-pay | Admitting: Obstetrics and Gynecology

## 2013-07-21 VITALS — BP 98/60 | HR 80 | Resp 18 | Ht 64.75 in | Wt 122.0 lb

## 2013-07-21 DIAGNOSIS — N952 Postmenopausal atrophic vaginitis: Secondary | ICD-10-CM

## 2013-07-21 MED ORDER — ESTROGENS, CONJUGATED 0.625 MG/GM VA CREA: TOPICAL_CREAM | VAGINAL | Status: DC

## 2013-07-21 NOTE — Progress Notes (Signed)
GYNECOLOGY  VISIT   HPI: 53 y.o.   Married  Caucasian  female   G2P2002 with Patient's last menstrual period was 05/18/2011.   here for   Follow up DILATATION & CURRETTAGE/HYSTEROSCOPY WITH RESECTOCOPE on 07/05/13 Pathology showed benign polyp of endometrium. No problems following surgery.   What bothers patient most is having irritation to the entrance of the vagina.  Vagifem not really helping this.  Wants evaluation for vaginitis today.  Has had problems with yeast infections.   GYNECOLOGIC HISTORY: Patient's last menstrual period was 05/18/2011. Contraception: Condoms, Post- Menopausal. Menopausal hormone therapy: Estradiol Vaginal Tab        OB History   Grav Para Term Preterm Abortions TAB SAB Ect Mult Living   2 2 2       2          Patient Active Problem List   Diagnosis Date Noted  . Postmenopausal bleeding 05/04/2013  . Atrophic vaginitis 02/03/2013    Class: Acute    Past Medical History  Diagnosis Date  . MVP (mitral valve prolapse)   . Fibrocystic breast   . Acid reflux   . Colon polyps     Past Surgical History  Procedure Laterality Date  . No past surgeries    . Dilatation & currettage/hysteroscopy with resectocope N/A 07/05/2013    Procedure: DILATATION & CURETTAGE/HYSTEROSCOPY WITH RESECTOCOPE of polyp;  Surgeon: Jacqualin CombesBrook E Amundson de Gwenevere Ghaziarvalho E Silva, MD;  Location: [facility];  Service: Gynecology;  Laterality: N/A;    Current Outpatient Prescriptions  Medication Sig Dispense Refill  . atorvastatin (LIPITOR) 10 MG tablet Take 10 mg by mouth daily.      Marland Kitchen. esomeprazole (NEXIUM) 40 MG capsule Take 40 mg by mouth daily as needed (heartburn).       . Estradiol 10 MCG TABS vaginal tablet Place 1 tablet vaginally 2 (two) times a week.       Marland Kitchen. ibuprofen (ADVIL,MOTRIN) 800 MG tablet Take 1 tablet (800 mg total) by mouth every 8 (eight) hours as needed.  30 tablet  0  . Inulin (FIBERCHOICE PO) Take 1 capsule by mouth daily. With calcium & vitamin d      .  Multiple Vitamins-Minerals (MULTIVITAMIN PO) Take 1 tablet by mouth daily.       . Probiotic Product (PHILLIPS COLON HEALTH PO) Take 1 capsule by mouth daily. occ       No current facility-administered medications for this visit.     ALLERGIES: Sulfa antibiotics  Family History  Problem Relation Age of Onset  . Hyperlipidemia Mother   . Hyperlipidemia Father   . Cancer Maternal Grandfather     colon  . Diabetes Maternal Grandfather   . Breast cancer Paternal Grandmother     History   Social History  . Marital Status: Married    Spouse Name: N/A    Number of Children: N/A  . Years of Education: N/A   Occupational History  . Not on file.   Social History Main Topics  . Smoking status: Never Smoker   . Smokeless tobacco: Never Used  . Alcohol Use: 0.5 oz/week    1 drink(s) per week     Comment: occ glass of wine  . Drug Use: No  . Sexual Activity: Yes    Partners: Male    Birth Control/ Protection: Condom, None, Post-menopausal   Other Topics Concern  . Not on file   Social History Narrative  . No narrative on file    ROS:  Pertinent items are noted in HPI.  PHYSICAL EXAMINATION:    BP 98/60  Pulse 80  Resp 18  Ht 5' 4.75" (1.645 m)  Wt 122 lb (55.339 kg)  BMI 20.45 kg/m2  LMP 05/18/2011     General appearance: alert, cooperative and appears stated age    Pelvic: External genitalia:  no lesions              Urethra:  normal appearing urethra with no masses, tenderness or lesions              Bartholins and Skenes: normal                 Vagina: normal appearing vagina with normal color and discharge, no lesions              Cervix: normal appearance. Tenaculum mark noted on the anterior cervical lip.                    Bimanual Exam:  Uterus:  uterus is normal size, shape, consistency and nontender                                      Adnexa: normal adnexa in size, nontender and no masses                                     Wet prep - pH 4.5,  negative for yeast, clue cells, and trichomonas.   ASSESSMENT  Status post hysteroscopic resection of endometrial polyp. Atrophic vaginitis.   PLAN  Stop Vagifem.  Switch to Premarin vaginal cream 1/2 gm to vagina/vulva nightly for 2 weeks and then twice weekly. See Epic orders. Return for recheck with Sara Chu, primary GYN provider in 2 months.    An After Visit Summary was printed and given to the patient.  _15___ minutes face to face time of which over 50% was spent in counseling.

## 2013-07-26 ENCOUNTER — Other Ambulatory Visit: Payer: Self-pay

## 2013-07-26 DIAGNOSIS — Z1231 Encounter for screening mammogram for malignant neoplasm of breast: Secondary | ICD-10-CM

## 2013-08-22 ENCOUNTER — Encounter: Payer: Self-pay | Admitting: Certified Nurse Midwife

## 2013-08-22 ENCOUNTER — Ambulatory Visit (INDEPENDENT_AMBULATORY_CARE_PROVIDER_SITE_OTHER): Payer: BC Managed Care – PPO | Admitting: Certified Nurse Midwife

## 2013-08-22 VITALS — BP 100/64 | HR 68 | Resp 16 | Ht 64.75 in | Wt 125.0 lb

## 2013-08-22 DIAGNOSIS — N898 Other specified noninflammatory disorders of vagina: Secondary | ICD-10-CM

## 2013-08-22 DIAGNOSIS — N9489 Other specified conditions associated with female genital organs and menstrual cycle: Secondary | ICD-10-CM

## 2013-08-22 NOTE — Patient Instructions (Signed)

## 2013-08-22 NOTE — Progress Notes (Signed)
53 yo married female g2p2002 here with complaint of vaginal symptoms of slight tenderness with sexual activity. Patient has switched from Vagifem to Premarin cream for vaginal dryness and has only used it for the first week. Denies burning or vaginal bleeding or pain. "Feels like the cream is working OK". Please check for yeast due to chronic history. Onset of symptoms 7 days ago. Denies new personal products, or in pool recently. No STD concerns. Urinary symptoms none .   O:Healthy female WDWN Affect: normal, orientation x 3  Exam: Abdomen:soft Lymph node: no enlargement or tenderness Pelvic exam: External genital: no redness or lesions noted, just slight increase pink inside labia bilateral, non tender BUS: negative Vagina: normal discharge with moisture noted noted. Ph: 4.0  ,Wet prep taken Cervix: normal, non tender Uterus: normal, non tender Adnexa:normal, non tender, no masses or fullness noted   Wet Prep results:negative for pathogens   A:Normal pelvic exam Negative wet prep. Vaginal dryness using Premarin cream now, previous Vagifem use. Working well at present.   P:Discussed findings of no yeast in vagina and normal pelvic exam. Continue to use Premarin as prescribed and  Apply small small amount at entrance of vagina where she feels slight tenderness. Use Coconut oil for vaginal moisture with sexual activity. Reassured. Questions addressed.  Rv prn, aex later in year due to recent evaluation and surgery for PMB.

## 2013-08-24 NOTE — Progress Notes (Signed)
Reviewed personally.  M. Suzanne Pace Lamadrid, MD.  

## 2013-08-27 IMAGING — CR DG RIBS 2V*L*
2 series · 2 of 2 positions shown · non-contrast
Comparison: None.

CLINICAL DATA: Left chest wall pain for several months

LEFT RIBS - 2 VIEW

[view not recorded (1 of 2)]
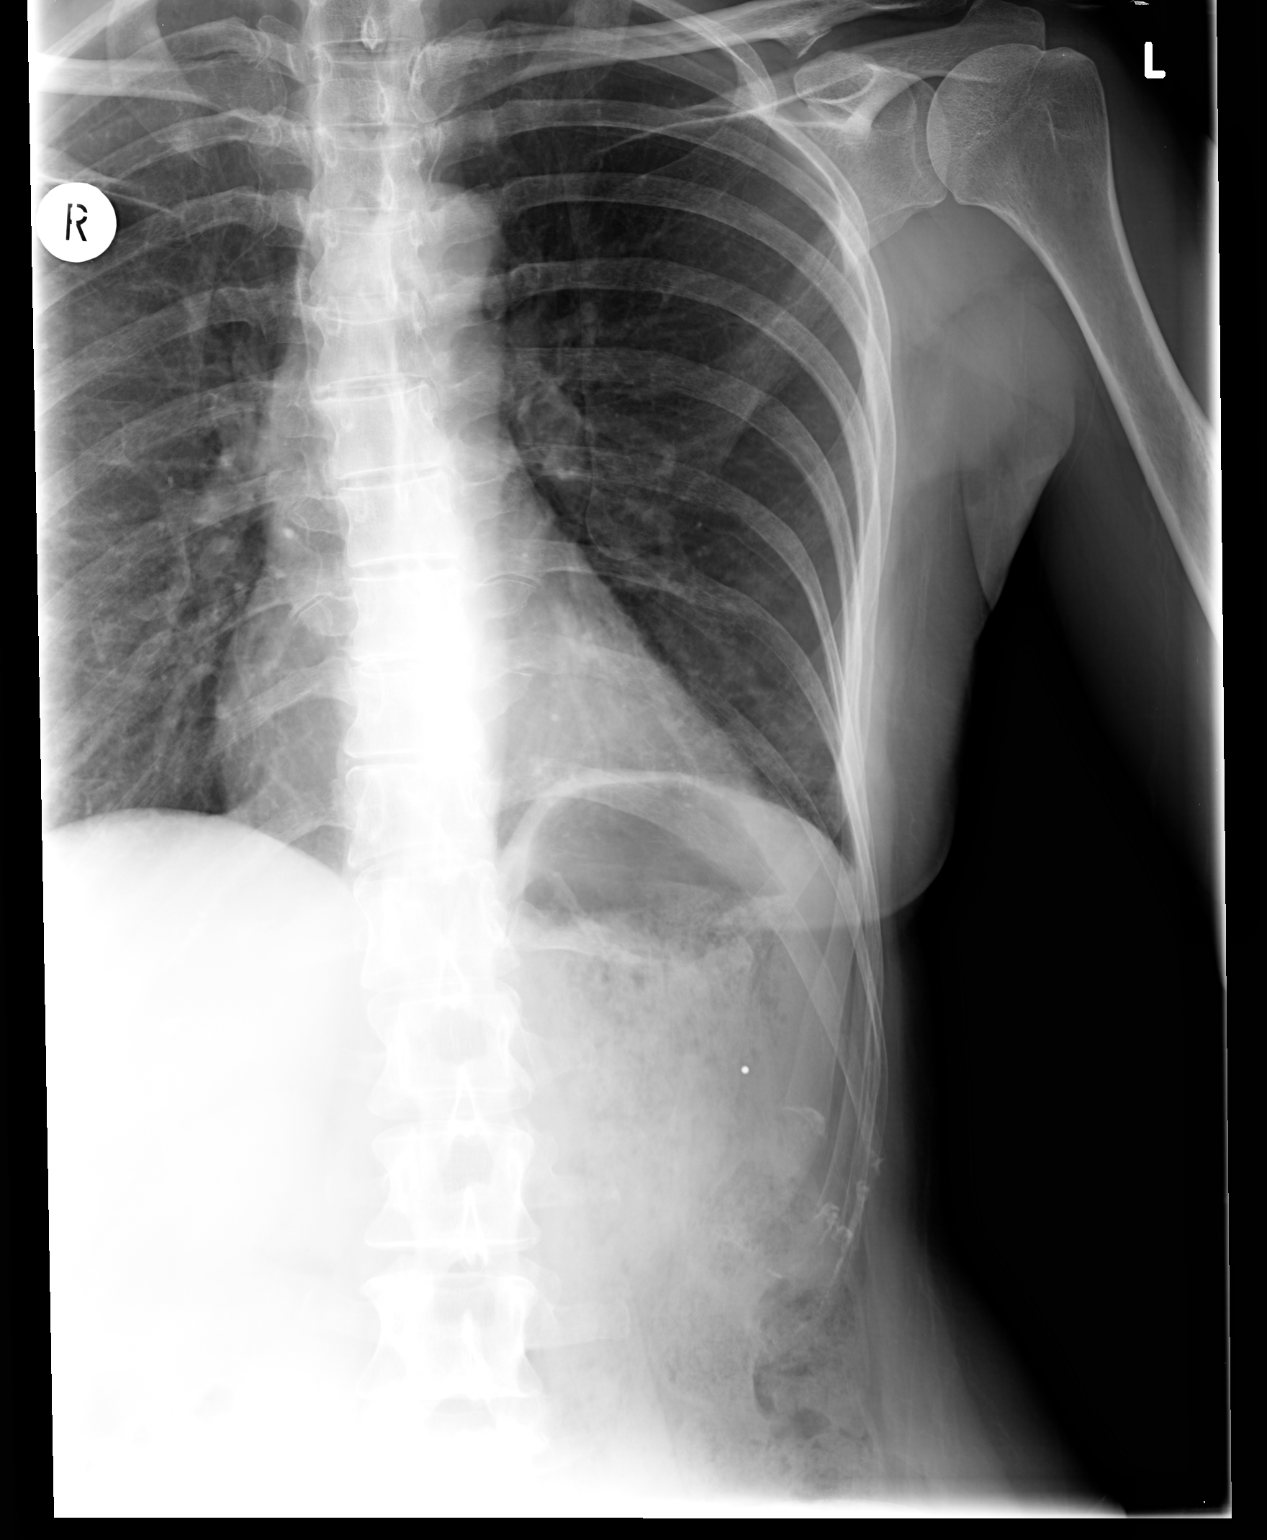

[view not recorded (2 of 2)]
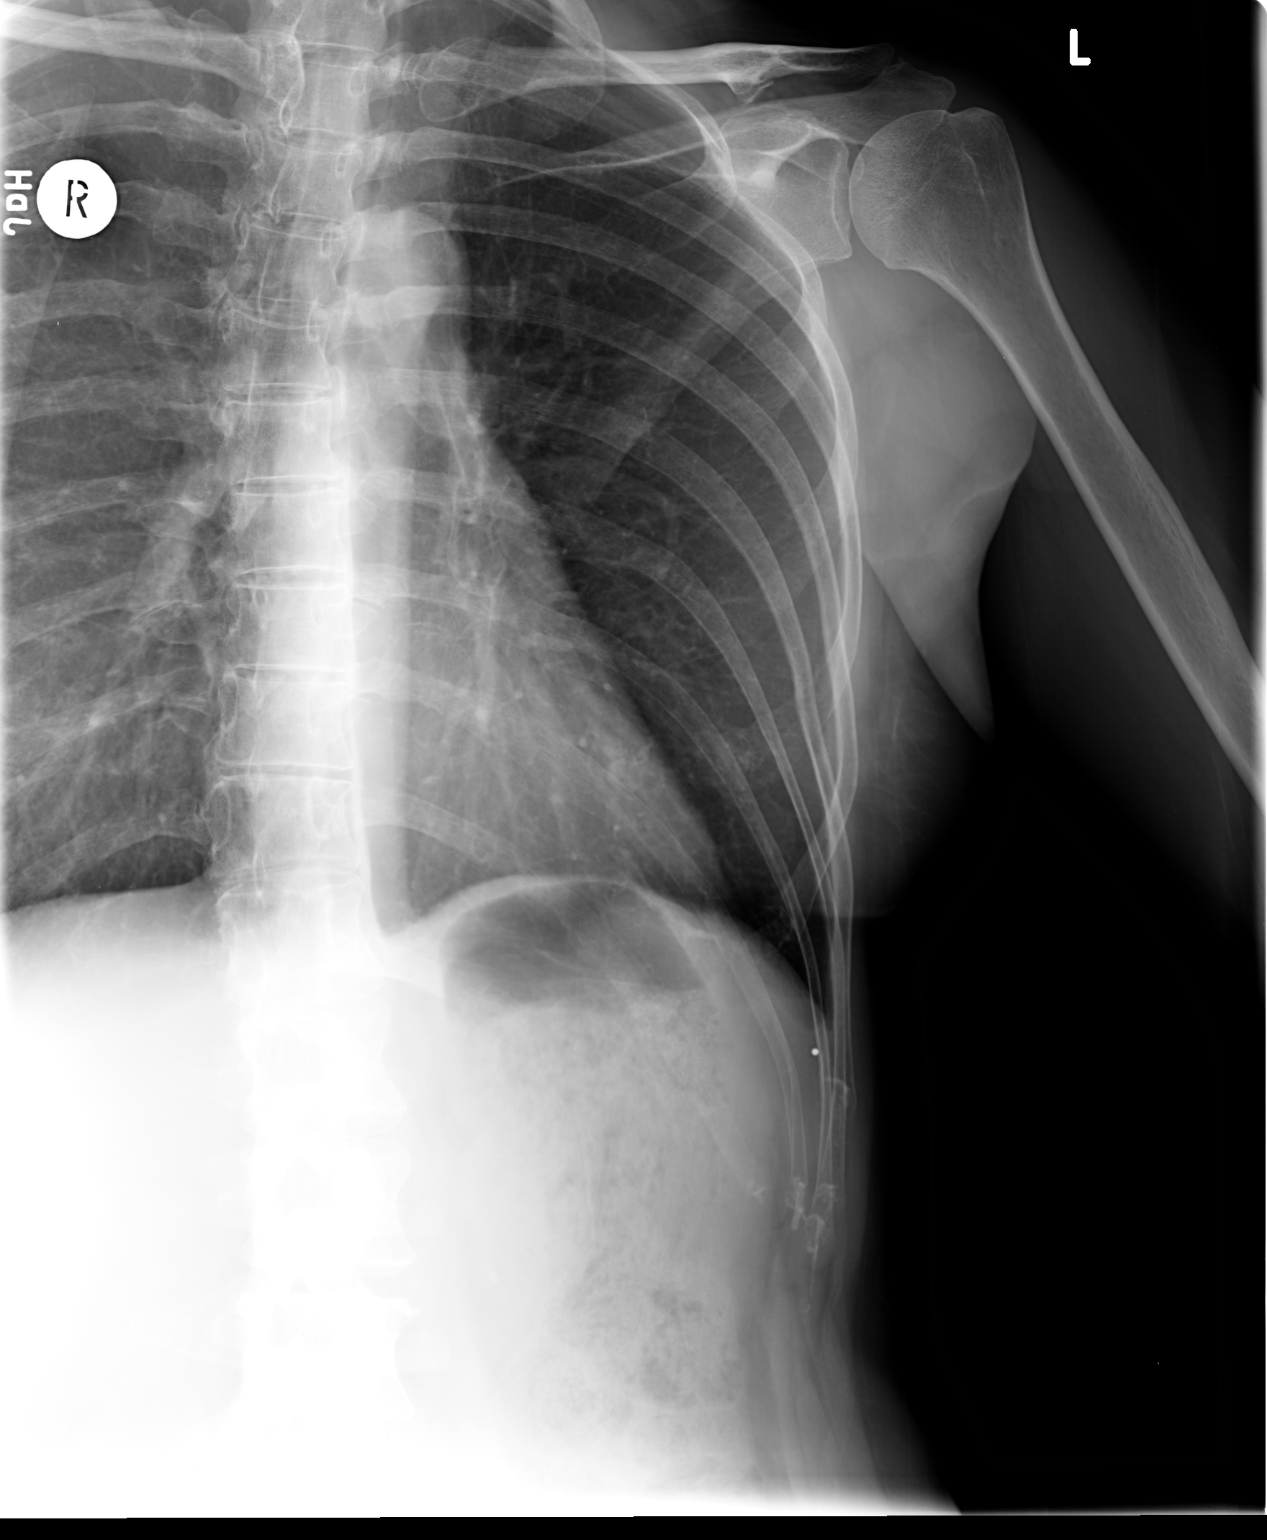

[2 of 2 positions shown; findings below may reference images not displayed]

FINDINGS: The area of pain overlying the left lower chest was
marked with a small metallic marker.  No underlying rib fracture is
seen.  No lytic or blastic lesion is noted.
IMPRESSION: Negative left rib detail.

## 2013-09-20 ENCOUNTER — Ambulatory Visit
Admission: RE | Admit: 2013-09-20 | Discharge: 2013-09-20 | Disposition: A | Discharge status: Home / Self Care | Payer: BC Managed Care – PPO | Source: Ambulatory Visit

## 2013-09-20 DIAGNOSIS — Z1231 Encounter for screening mammogram for malignant neoplasm of breast: Secondary | ICD-10-CM

## 2013-11-14 ENCOUNTER — Encounter: Payer: Self-pay | Admitting: Certified Nurse Midwife

## 2013-12-23 ENCOUNTER — Encounter: Payer: Self-pay | Admitting: Certified Nurse Midwife

## 2013-12-23 ENCOUNTER — Ambulatory Visit (INDEPENDENT_AMBULATORY_CARE_PROVIDER_SITE_OTHER): Payer: BC Managed Care – PPO | Admitting: Certified Nurse Midwife

## 2013-12-23 VITALS — BP 112/72 | HR 70 | Resp 16 | Ht 64.75 in | Wt 125.0 lb

## 2013-12-23 DIAGNOSIS — Z01419 Encounter for gynecological examination (general) (routine) without abnormal findings: Secondary | ICD-10-CM

## 2013-12-23 DIAGNOSIS — N952 Postmenopausal atrophic vaginitis: Secondary | ICD-10-CM

## 2013-12-23 DIAGNOSIS — Z124 Encounter for screening for malignant neoplasm of cervix: Secondary | ICD-10-CM

## 2013-12-23 DIAGNOSIS — Z0000 Encounter for general adult medical examination without abnormal findings: Secondary | ICD-10-CM

## 2013-12-23 LAB — COMPREHENSIVE METABOLIC PANEL
ALK PHOS: 74 U/L (ref 39–117)
ALT: 24 U/L (ref 0–35)
AST: 23 U/L (ref 0–37)
Albumin: 4.9 g/dL (ref 3.5–5.2)
BUN: 16 mg/dL (ref 6–23)
CO2: 23 mEq/L (ref 19–32)
Calcium: 10.4 mg/dL (ref 8.4–10.5)
Chloride: 102 mEq/L (ref 96–112)
Creat: 0.72 mg/dL (ref 0.50–1.10)
Glucose, Bld: 93 mg/dL (ref 70–99)
Potassium: 4.7 mEq/L (ref 3.5–5.3)
SODIUM: 139 meq/L (ref 135–145)
Total Bilirubin: 0.6 mg/dL (ref 0.2–1.2)
Total Protein: 7.5 g/dL (ref 6.0–8.3)

## 2013-12-23 LAB — POCT URINALYSIS DIPSTICK
Bilirubin, UA: NEGATIVE
Blood, UA: NEGATIVE
GLUCOSE UA: NEGATIVE
Ketones, UA: NEGATIVE
LEUKOCYTES UA: NEGATIVE
Nitrite, UA: NEGATIVE
PROTEIN UA: NEGATIVE
Urobilinogen, UA: NEGATIVE
pH, UA: 5

## 2013-12-23 LAB — CBC
HCT: 39.5 % (ref 36.0–46.0)
Hemoglobin: 13.8 g/dL (ref 12.0–15.0)
MCH: 30.7 pg (ref 26.0–34.0)
MCHC: 34.9 g/dL (ref 30.0–36.0)
MCV: 88 fL (ref 78.0–100.0)
MPV: 10.4 fL (ref 9.4–12.4)
PLATELETS: 306 10*3/uL (ref 150–400)
RBC: 4.49 MIL/uL (ref 3.87–5.11)
RDW: 12.9 % (ref 11.5–15.5)
WBC: 5.3 10*3/uL (ref 4.0–10.5)

## 2013-12-23 LAB — TSH: TSH: 0.729 u[IU]/mL (ref 0.350–4.500)

## 2013-12-23 LAB — HEMOGLOBIN, FINGERSTICK: HEMOGLOBIN, FINGERSTICK: 13.7 g/dL (ref 12.0–16.0)

## 2013-12-23 MED ORDER — ESTROGENS, CONJUGATED 0.625 MG/GM VA CREA: TOPICAL_CREAM | VAGINAL | Status: DC

## 2013-12-23 NOTE — Progress Notes (Signed)
53 y.o. 222P2002 Married Caucasian Fe here for annual exam. Menopausal no vaginal bleeding or vaginal dryness. Using Premarin Cream with good results for dryness and no more yeast vaginitis! Sees PCP for Lipid follow up only. Desires screening labs today. No health issues today.   Patient's last menstrual period was 05/18/2011.          Sexually active: Yes.    The current method of family planning is post menopausal status.    Exercising: Yes.    walking Smoker:  no  Health Maintenance: Pap:  05-06-11 neg HPV HR neg MMG: 09-20-13 density category c, birads category 1:neg Colonoscopy:  2013 BMD:   none TDaP:  2007 Labs: Poct urine-neg, Hgb-13.7 Self breast exam: done monthly   reports that she has never smoked. She has never used smokeless tobacco. She reports that she drinks about 0.6 oz of alcohol per week. She reports that she does not use illicit drugs.  Past Medical History  Diagnosis Date  . MVP (mitral valve prolapse)   . Fibrocystic breast   . Acid reflux   . Colon polyps     Past Surgical History  Procedure Laterality Date  . No past surgeries    . Dilatation & currettage/hysteroscopy with resectocope N/A 07/05/2013    Procedure: DILATATION & CURETTAGE/HYSTEROSCOPY WITH RESECTOCOPE of polyp;  Surgeon: Jacqualin CombesBrook E Amundson de Gwenevere Ghaziarvalho E Silva, MD;  Location: [facility];  Service: Gynecology;  Laterality: N/A;    Current Outpatient Prescriptions  Medication Sig Dispense Refill  . atorvastatin (LIPITOR) 10 MG tablet Take 10 mg by mouth daily.    . cetirizine (ZYRTEC) 10 MG tablet Take 10 mg by mouth daily.    Marland Kitchen. conjugated estrogens (PREMARIN) vaginal cream Use 1/2 g vaginally every night 2 weeks, then use 1/2 g vaginally two or three times per week as needed to maintain symptom relief. 60 g 3  . esomeprazole (NEXIUM) 40 MG capsule Take 40 mg by mouth daily as needed (heartburn).     Marland Kitchen. KRILL OIL PO Take by mouth daily.    . Multiple Vitamins-Minerals (MULTIVITAMIN PO) Take 1 tablet  by mouth daily.     . Probiotic Product (PHILLIPS COLON HEALTH PO) Take 1 capsule by mouth daily. occ     No current facility-administered medications for this visit.    Family History  Problem Relation Age of Onset  . Hyperlipidemia Mother   . Hyperlipidemia Father   . Cancer Maternal Grandfather     colon  . Diabetes Maternal Grandfather   . Breast cancer Paternal Grandmother     ROS:  Pertinent items are noted in HPI.  Otherwise, a comprehensive ROS was negative.  Exam:   BP 112/72 mmHg  Pulse 70  Resp 16  Ht 5' 4.75" (1.645 m)  Wt 125 lb (56.7 kg)  BMI 20.95 kg/m2  LMP 05/18/2011 Height: 5' 4.75" (164.5 cm)  Ht Readings from Last 3 Encounters:  12/23/13 5' 4.75" (1.645 m)  08/22/13 5' 4.75" (1.645 m)  07/21/13 5' 4.75" (1.645 m)    General appearance: alert, cooperative and appears stated age Head: Normocephalic, without obvious abnormality, atraumatic Neck: no adenopathy, supple, symmetrical, trachea midline and thyroid normal to inspection and palpation Lungs: clear to auscultation bilaterally Breasts: normal appearance, no masses or tenderness Heart: regular rate and rhythm Abdomen: soft, non-tender; no masses,  no organomegaly Extremities: extremities normal, atraumatic, no cyanosis or edema Skin: Skin color, texture, turgor normal. No rashes or lesions Lymph nodes: Cervical, supraclavicular, and axillary  nodes normal. No abnormal inguinal nodes palpated Neurologic: Grossly normal   Pelvic: External genitalia:  no lesions              Urethra:  normal appearing urethra with no masses, tenderness or lesions              Bartholin's and Skene's: normal                 Vagina: normal appearing vagina with normal color and discharge, no lesions              Cervix: normal, non tender, no lesions              Pap taken: Yes.   Bimanual Exam:  Uterus:  normal size, contour, position, consistency, mobility, non-tender and anteverted              Adnexa: normal  adnexa and no mass, fullness, tenderness               Rectovaginal: Confirms               Anus:  normal sphincter tone, no lesions  A:  Well Woman with normal exam  Menopausal no HRT  Atrophic vaginitis with Premarin use  Screening labs  P:   Reviewed health and wellness pertinent to exam  Aware of need to evaluate if vaginal dryness  Rx Premarin see order  Lab: CMP,Vit. D,TSH, CBC  Pap smear taken today with HPV reflex  Mammogram yearly  Reviewed health and wellness pertinent to exam.  Reviewed importance of diet and exercise.  return annually or prn  An After Visit Summary was printed and given to the patient.

## 2013-12-23 NOTE — Patient Instructions (Signed)

## 2013-12-24 LAB — VITAMIN D 25 HYDROXY (VIT D DEFICIENCY, FRACTURES): Vit D, 25-Hydroxy: 44 ng/mL (ref 30–100)

## 2013-12-26 NOTE — Progress Notes (Signed)
Reviewed personally.  M. Suzanne Nereyda Bowler, MD.  

## 2013-12-27 LAB — IPS PAP TEST WITH REFLEX TO HPV

## 2014-04-04 ENCOUNTER — Other Ambulatory Visit: Payer: Self-pay | Admitting: Gastroenterology

## 2014-04-04 DIAGNOSIS — R1012 Left upper quadrant pain: Secondary | ICD-10-CM

## 2014-04-10 ENCOUNTER — Other Ambulatory Visit: Payer: Self-pay

## 2014-04-14 ENCOUNTER — Ambulatory Visit
Admission: RE | Admit: 2014-04-14 | Discharge: 2014-04-14 | Disposition: A | Discharge status: Home / Self Care | Payer: Managed Care, Other (non HMO) | Source: Ambulatory Visit | Attending: Gastroenterology | Admitting: Gastroenterology

## 2014-04-14 DIAGNOSIS — R1012 Left upper quadrant pain: Secondary | ICD-10-CM

## 2014-06-14 ENCOUNTER — Other Ambulatory Visit: Payer: Self-pay | Admitting: Gastroenterology

## 2014-08-28 ENCOUNTER — Other Ambulatory Visit: Payer: Self-pay

## 2014-08-28 DIAGNOSIS — Z1231 Encounter for screening mammogram for malignant neoplasm of breast: Secondary | ICD-10-CM

## 2014-09-22 ENCOUNTER — Ambulatory Visit
Admission: RE | Admit: 2014-09-22 | Discharge: 2014-09-22 | Disposition: A | Discharge status: Home / Self Care | Payer: 59 | Source: Ambulatory Visit

## 2014-09-22 DIAGNOSIS — Z1231 Encounter for screening mammogram for malignant neoplasm of breast: Secondary | ICD-10-CM

## 2014-12-11 ENCOUNTER — Other Ambulatory Visit: Payer: Self-pay | Admitting: Obstetrics and Gynecology

## 2014-12-11 DIAGNOSIS — N952 Postmenopausal atrophic vaginitis: Secondary | ICD-10-CM

## 2014-12-11 NOTE — Telephone Encounter (Signed)
Medication refill request: Premarine 30 mg Last AEX:  12/23/2013 DL Next AEX: 16/10/960412/13/2016 DL Last MMG (if hormonal medication request): 09/29/2014 BIRADS 1 Refill authorized: 12/23/2013 Premarine Dispense 60 g 4 Refills Today: #60 g 0 Refills ?

## 2014-12-26 ENCOUNTER — Ambulatory Visit (INDEPENDENT_AMBULATORY_CARE_PROVIDER_SITE_OTHER): Payer: 59 | Admitting: Certified Nurse Midwife

## 2014-12-26 ENCOUNTER — Encounter: Payer: Self-pay | Admitting: Certified Nurse Midwife

## 2014-12-26 VITALS — BP 114/78 | HR 60 | Resp 14 | Ht 64.7 in | Wt 130.4 lb

## 2014-12-26 DIAGNOSIS — Z01419 Encounter for gynecological examination (general) (routine) without abnormal findings: Secondary | ICD-10-CM | POA: Diagnosis not present

## 2014-12-26 DIAGNOSIS — Z124 Encounter for screening for malignant neoplasm of cervix: Secondary | ICD-10-CM | POA: Diagnosis not present

## 2014-12-26 DIAGNOSIS — N952 Postmenopausal atrophic vaginitis: Secondary | ICD-10-CM | POA: Diagnosis not present

## 2014-12-26 MED ORDER — ESTROGENS, CONJUGATED 0.625 MG/GM VA CREA: TOPICAL_CREAM | VAGINAL | Status: DC

## 2014-12-26 NOTE — Patient Instructions (Signed)

## 2014-12-26 NOTE — Progress Notes (Signed)
54 y.o. 32P2002 Married  Caucasian Fe here for annual exam. Menopausal no HRT. Denies vaginal bleeding. Using Premarin cream for vaginal dryness, working well. Sees PCP for aex/cholesterol management and labs every 6 months. Recent mammogram with density of C and wondered if she should have 3 D mammogram yearly. No other health issues today.  Patient's last menstrual period was 05/18/2011.          Sexually active: Yes.    The current method of family planning is post menopausal status.    Exercising: Yes.    Brisk walking Smoker:  yes  Health Maintenance: Pap:  12/23/2013 Negative MMG:  09/22/14 BIRADS 1 Negative density C Colonoscopy:  2016 polyps no problems as per patient pre cancerous in the past BMD:   None TDaP: 2007 Labs: PCP   reports that she has never smoked. She has never used smokeless tobacco. She reports that she drinks about 1.2 oz of alcohol per week. She reports that she does not use illicit drugs.  Past Medical History  Diagnosis Date  . MVP (mitral valve prolapse)   . Fibrocystic breast   . Acid reflux   . Colon polyps     Past Surgical History  Procedure Laterality Date  . No past surgeries    . Dilatation & currettage/hysteroscopy with resectocope N/A 07/05/2013    Procedure: DILATATION & CURETTAGE/HYSTEROSCOPY WITH RESECTOCOPE of polyp;  Surgeon: Jacqualin CombesBrook E Amundson de Gwenevere Ghaziarvalho E Silva, MD;  Location: [facility];  Service: Gynecology;  Laterality: N/A;    Current Outpatient Prescriptions  Medication Sig Dispense Refill  . atorvastatin (LIPITOR) 10 MG tablet Take 10 mg by mouth daily.    . cetirizine (ZYRTEC) 10 MG tablet Take 10 mg by mouth daily.    Marland Kitchen. conjugated estrogens (PREMARIN) vaginal cream Use 1/2 g vaginally every night 2 weeks, then use 1/2 g vaginally two or three times per week as needed to maintain symptom relief. 60 g 4  . conjugated estrogens (PREMARIN) vaginal cream Use 1/2 gm twice a week intravaginally 30 g 0  . esomeprazole (NEXIUM) 40 MG  capsule Take 40 mg by mouth daily as needed (heartburn).     Marland Kitchen. KRILL OIL PO Take by mouth daily.    . Multiple Vitamins-Minerals (MULTIVITAMIN PO) Take 1 tablet by mouth daily.     . Probiotic Product (PHILLIPS COLON HEALTH PO) Take 1 capsule by mouth daily. occ     No current facility-administered medications for this visit.    Family History  Problem Relation Age of Onset  . Hyperlipidemia Mother   . Hyperlipidemia Father   . Cancer Maternal Grandfather     colon  . Diabetes Maternal Grandfather   . Breast cancer Paternal Grandmother     ROS:  Pertinent items are noted in HPI.  Otherwise, a comprehensive ROS was negative.  Exam:   BP 114/78 mmHg  Pulse 60  Resp 14  Ht 5' 4.7" (1.643 m)  Wt 130 lb 6.4 oz (59.149 kg)  BMI 21.91 kg/m2  LMP 05/18/2011 Height: 5' 4.7" (164.3 cm) Ht Readings from Last 3 Encounters:  12/26/14 5' 4.7" (1.643 m)  12/23/13 5' 4.75" (1.645 m)  08/22/13 5' 4.75" (1.645 m)    General appearance: alert, cooperative and appears stated age Head: Normocephalic, without obvious abnormality, atraumatic Neck: no adenopathy, supple, symmetrical, trachea midline and thyroid normal to inspection and palpation Lungs: clear to auscultation bilaterally Breasts: normal appearance, no masses or tenderness, No nipple retraction or dimpling, No nipple discharge  or bleeding, No axillary or supraclavicular adenopathy Heart: regular rate and rhythm Abdomen: soft, non-tender; no masses,  no organomegaly Extremities: extremities normal, atraumatic, no cyanosis or edema Skin: Skin color, texture, turgor normal. No rashes or lesions Lymph nodes: Cervical, supraclavicular, and axillary nodes normal. No abnormal inguinal nodes palpated Neurologic: Grossly normal   Pelvic: External genitalia:  no lesions              Urethra:  normal appearing urethra with no masses, tenderness or lesions              Bartholin's and Skene's: normal                 Vagina: normal  appearing vagina with normal color and discharge, no lesions              Cervix: normal,non tender, no lesions              Pap taken: Yes.   Bimanual Exam:  Uterus:  normal size, contour, position, consistency, mobility, non-tender              Adnexa: normal adnexa and no mass, fullness, tenderness               Rectovaginal: Confirms               Anus:  normal sphincter tone, no lesions  Chaperone present: yes  A:  Well Woman with normal exam  Menopausal no HRT   Atrophic Vagiitis Premarin working well, desires continuance  History of endometrial polyp  Cholesterol management with PCP  P:   Reviewed health and wellness pertinent to exam  Aware of need to advise if vaginal bleeding  Rx Premarin cream see order  Continue follow up as indicated with MD    Pap smear as above with HPV reflex   counseled on breast self exam, mammography screening, feminine hygiene, adequate intake of calcium and vitamin D, diet and exercise  return annually or prn  An After Visit Summary was printed and given to the patient.

## 2014-12-27 LAB — IPS PAP TEST WITH REFLEX TO HPV

## 2014-12-27 NOTE — Progress Notes (Signed)
Reviewed personally.  M. Suzanne Eluterio Seymour, MD.  

## 2015-09-24 ENCOUNTER — Other Ambulatory Visit: Payer: Self-pay | Admitting: Certified Nurse Midwife

## 2015-09-24 DIAGNOSIS — Z1231 Encounter for screening mammogram for malignant neoplasm of breast: Secondary | ICD-10-CM

## 2015-10-02 ENCOUNTER — Ambulatory Visit
Admission: RE | Admit: 2015-10-02 | Discharge: 2015-10-02 | Disposition: A | Discharge status: Home / Self Care | Payer: 59 | Source: Ambulatory Visit | Attending: Certified Nurse Midwife | Admitting: Certified Nurse Midwife

## 2015-10-02 DIAGNOSIS — Z1231 Encounter for screening mammogram for malignant neoplasm of breast: Secondary | ICD-10-CM

## 2015-12-26 ENCOUNTER — Ambulatory Visit: Payer: 59 | Admitting: Certified Nurse Midwife

## 2016-01-02 ENCOUNTER — Ambulatory Visit (INDEPENDENT_AMBULATORY_CARE_PROVIDER_SITE_OTHER): Payer: 59 | Admitting: Certified Nurse Midwife

## 2016-01-02 ENCOUNTER — Encounter: Payer: Self-pay | Admitting: Certified Nurse Midwife

## 2016-01-02 VITALS — BP 120/80 | HR 76 | Resp 16 | Ht 64.25 in | Wt 116.6 lb

## 2016-01-02 DIAGNOSIS — Z01419 Encounter for gynecological examination (general) (routine) without abnormal findings: Secondary | ICD-10-CM

## 2016-01-02 DIAGNOSIS — Z0000 Encounter for general adult medical examination without abnormal findings: Secondary | ICD-10-CM

## 2016-01-02 DIAGNOSIS — N952 Postmenopausal atrophic vaginitis: Secondary | ICD-10-CM | POA: Diagnosis not present

## 2016-01-02 LAB — POCT URINALYSIS DIPSTICK
BILIRUBIN UA: NEGATIVE
GLUCOSE UA: NEGATIVE
KETONES UA: NEGATIVE
LEUKOCYTES UA: NEGATIVE
Nitrite, UA: NEGATIVE
Protein, UA: NEGATIVE
Urobilinogen, UA: NEGATIVE
pH, UA: 5

## 2016-01-02 MED ORDER — ESTROGENS, CONJUGATED 0.625 MG/GM VA CREA: TOPICAL_CREAM | VAGINAL | Status: DC

## 2016-01-02 NOTE — Progress Notes (Signed)
55 y.o. 492P2002 Married  Caucasian Fe here for annual exam.Menopausal no HRT. Denies vaginal bleeding or vaginal dryness. Sees PCP Dr. Herbert MoorsSwaim for Cholesterol management every 6 months and all labs and aex. Still using Premarin vaginal cream which works well. No issues with dryness. No health issues today. Two daughters getting married in same one year period! Screening labs if needed.  Health Maintenance: Pap:  12-26-14 neg MMG:  10-02-15 category c density birads 1:neg Colonoscopy:  2016 polyps BMD:   none TDaP:  2007-will have with PCP HIV: 1991 Labs: PCP  Urine: Trace Blood, 5.0 pH    reports that she has never smoked. She has never used smokeless tobacco. She reports that she drinks about 1.2 oz of alcohol per week . She reports that she does not use drugs.  Past Medical History:  Diagnosis Date  . Acid reflux   . Colon polyps   . Fibrocystic breast   . MVP (mitral valve prolapse)     Past Surgical History:  Procedure Laterality Date  . DILATATION & CURRETTAGE/HYSTEROSCOPY WITH RESECTOCOPE N/A 07/05/2013   Procedure: DILATATION & CURETTAGE/HYSTEROSCOPY WITH RESECTOCOPE of polyp;  Surgeon: Jacqualin CombesBrook E Amundson de Gwenevere Ghaziarvalho E Silva, MD;  Location: [facility];  Service: Gynecology;  Laterality: N/A;  . NO PAST SURGERIES      Current Outpatient Prescriptions  Medication Sig Dispense Refill  . atorvastatin (LIPITOR) 10 MG tablet Take 10 mg by mouth daily.    . cetirizine (ZYRTEC) 10 MG tablet Take 10 mg by mouth daily.    Marland Kitchen. conjugated estrogens (PREMARIN) vaginal cream Use 1/2 gm twice a week intravaginally 30 g 012  . esomeprazole (NEXIUM) 40 MG capsule Take 40 mg by mouth daily as needed (heartburn).     Marland Kitchen. KRILL OIL PO Take by mouth daily.    . Multiple Vitamins-Minerals (MULTIVITAMIN PO) Take 1 tablet by mouth daily.     . Probiotic Product (PHILLIPS COLON HEALTH PO) Take 1 capsule by mouth daily. occ     No current facility-administered medications for this visit.     Family History   Problem Relation Age of Onset  . Hyperlipidemia Mother   . Hyperlipidemia Father   . Cancer Maternal Grandfather     colon  . Diabetes Maternal Grandfather   . Breast cancer Paternal Grandmother     ROS:  Pertinent items are noted in HPI.  Otherwise, a comprehensive ROS was negative.  Exam:   LMP 05/18/2011    Ht Readings from Last 3 Encounters:  12/26/14 5' 4.7" (1.643 m)  12/23/13 5' 4.75" (1.645 m)  08/22/13 5' 4.75" (1.645 m)    General appearance: alert, cooperative and appears stated age Head: Normocephalic, without obvious abnormality, atraumatic Neck: no adenopathy, supple, symmetrical, trachea midline and thyroid normal to inspection and palpation Lungs: clear to auscultation bilaterally Breasts: normal appearance, no masses or tenderness, No nipple retraction or dimpling, No nipple discharge or bleeding, No axillary or supraclavicular adenopathy Heart: regular rate and rhythm Abdomen: soft, non-tender; no masses,  no organomegaly Extremities: extremities normal, atraumatic, no cyanosis or edema Skin: Skin color, texture, turgor normal. No rashes or lesions Lymph nodes: Cervical, supraclavicular, and axillary nodes normal. No abnormal inguinal nodes palpated Neurologic: Grossly normal   Pelvic: External genitalia:  no lesions              Urethra:  normal appearing urethra with no masses, tenderness or lesions              Bartholin's  and Skene's: normal                 Vagina: normal appearing vagina with normal color and discharge, no lesions              Cervix: multiparous appearance, no cervical motion tenderness and no lesions              Pap taken: No. Bimanual Exam:  Uterus:  normal size, contour, position, consistency, mobility, non-tender              Adnexa: normal adnexa and no mass, fullness, tenderness               Rectovaginal: Confirms               Anus:  normal sphincter tone, no lesions  Chaperone present: yes  A:  Well Woman with normal  exam  Menopausal no HRT  Atrophic Vaginitis Premarin working well  Cholesterol management with PCP  Screening labs  P:   Reviewed health and wellness pertinent to exameee  Aware of need to evaluate if vaginal bleeding  Rx Premarin see order with instructions, risks and benefits  And  warning signs discussed.  Continue MD follow up as indicated  Labs: Hep C  Pap smear as above not taken   counseled on breast self exam, mammography screening, menopause, adequate intake of calcium and vitamin D, diet and exercise  return annually or prn  An After Visit Summary was printed and given to the patient.

## 2016-01-02 NOTE — Patient Instructions (Signed)

## 2016-01-03 LAB — HEPATITIS C ANTIBODY: HCV Ab: NEGATIVE

## 2016-01-04 NOTE — Progress Notes (Signed)
Encounter reviewed Jaquay Posthumus, MD   

## 2016-09-17 ENCOUNTER — Other Ambulatory Visit: Payer: Self-pay | Admitting: Certified Nurse Midwife

## 2016-09-17 DIAGNOSIS — Z1231 Encounter for screening mammogram for malignant neoplasm of breast: Secondary | ICD-10-CM

## 2016-10-03 ENCOUNTER — Ambulatory Visit
Admission: RE | Admit: 2016-10-03 | Discharge: 2016-10-03 | Disposition: A | Discharge status: Home / Self Care | Payer: 59 | Source: Ambulatory Visit | Attending: Certified Nurse Midwife | Admitting: Certified Nurse Midwife

## 2016-10-03 DIAGNOSIS — Z1231 Encounter for screening mammogram for malignant neoplasm of breast: Secondary | ICD-10-CM

## 2016-10-06 ENCOUNTER — Other Ambulatory Visit: Payer: Self-pay | Admitting: Certified Nurse Midwife

## 2016-10-06 DIAGNOSIS — R928 Other abnormal and inconclusive findings on diagnostic imaging of breast: Secondary | ICD-10-CM

## 2016-10-13 ENCOUNTER — Ambulatory Visit
Admission: RE | Admit: 2016-10-13 | Discharge: 2016-10-13 | Disposition: A | Discharge status: Home / Self Care | Payer: 59 | Source: Ambulatory Visit | Attending: Certified Nurse Midwife | Admitting: Certified Nurse Midwife

## 2016-10-13 DIAGNOSIS — R928 Other abnormal and inconclusive findings on diagnostic imaging of breast: Secondary | ICD-10-CM

## 2017-02-04 ENCOUNTER — Other Ambulatory Visit: Payer: Self-pay

## 2017-02-04 ENCOUNTER — Encounter: Payer: Self-pay | Admitting: Certified Nurse Midwife

## 2017-02-04 ENCOUNTER — Other Ambulatory Visit (HOSPITAL_COMMUNITY)
Admission: RE | Admit: 2017-02-04 | Discharge: 2017-02-04 | Disposition: A | Discharge status: Home / Self Care | Payer: 59 | Source: Ambulatory Visit | Attending: Certified Nurse Midwife | Admitting: Certified Nurse Midwife

## 2017-02-04 ENCOUNTER — Ambulatory Visit (INDEPENDENT_AMBULATORY_CARE_PROVIDER_SITE_OTHER): Payer: 59 | Admitting: Certified Nurse Midwife

## 2017-02-04 VITALS — BP 120/76 | HR 72 | Resp 16 | Ht 64.25 in | Wt 117.0 lb

## 2017-02-04 DIAGNOSIS — Z1151 Encounter for screening for human papillomavirus (HPV): Secondary | ICD-10-CM | POA: Diagnosis not present

## 2017-02-04 DIAGNOSIS — N952 Postmenopausal atrophic vaginitis: Secondary | ICD-10-CM | POA: Diagnosis not present

## 2017-02-04 DIAGNOSIS — Z124 Encounter for screening for malignant neoplasm of cervix: Secondary | ICD-10-CM | POA: Diagnosis not present

## 2017-02-04 DIAGNOSIS — Z01419 Encounter for gynecological examination (general) (routine) without abnormal findings: Secondary | ICD-10-CM | POA: Diagnosis not present

## 2017-02-04 MED ORDER — ESTROGENS, CONJUGATED 0.625 MG/GM VA CREA: TOPICAL_CREAM | VAGINAL | 12 refills | Status: DC

## 2017-02-04 NOTE — Progress Notes (Signed)
57 y.o. G32P2002 Married  Caucasian Fe here for annual exam. Menopausal no HRT. Denies vaginal dryness now with Premarin cream use. Recent mammogram with cysts noted benign finding. Sees PCP for aex and labs and cholesterol management. Dr. Lilyan Gilford is her PCP. Exercises with walking, treadmill, classes. No health issues today.  Patient's last menstrual period was 05/23/2011.          Sexually active: Yes.    The current method of family planning is post menopausal status.    Exercising: Yes.    walking, treadmill, classes Smoker:  no  Health Maintenance: Pap:  12-26-14 neg History of Abnormal Pap: no MMG:  9/18 bilateral & rt breast u/s 10/18 cyst category c density birads 2:neg Self Breast exams: yes Colonoscopy:  2016 polyps f/u 56yrs BMD:   none TDaP:  2018? Shingles: not done Pneumonia: not done Hep C and HIV: hep c neg 2017 Labs: with PCP   reports that  has never smoked. she has never used smokeless tobacco. She reports that she drinks alcohol. She reports that she does not use drugs.  Past Medical History:  Diagnosis Date  . Acid reflux   . Colon polyps   . Fibrocystic breast   . MVP (mitral valve prolapse)     Past Surgical History:  Procedure Laterality Date  . DILATATION & CURRETTAGE/HYSTEROSCOPY WITH RESECTOCOPE N/A 07/05/2013   Procedure: DILATATION & CURETTAGE/HYSTEROSCOPY WITH RESECTOCOPE of polyp;  Surgeon: Jacqualin Combes de Gwenevere Ghazi, MD;  Location: [facility];  Service: Gynecology;  Laterality: N/A;  . NO PAST SURGERIES      Current Outpatient Medications  Medication Sig Dispense Refill  . atorvastatin (LIPITOR) 10 MG tablet Take 10 mg by mouth daily.    Marland Kitchen BIOTIN PO Take by mouth.    . conjugated estrogens (PREMARIN) vaginal cream Use 1/2 gm twice a week intravaginally 30 g 012  . KRILL OIL PO Take by mouth daily.    . Multiple Vitamins-Minerals (MULTIVITAMIN PO) Take 1 tablet by mouth daily.     . ranitidine (ZANTAC) 150 MG tablet Take 150 mg by mouth  daily.      No current facility-administered medications for this visit.     Family History  Problem Relation Age of Onset  . Hyperlipidemia Mother   . Hyperlipidemia Father   . Cancer Maternal Grandfather        colon  . Diabetes Maternal Grandfather   . Breast cancer Paternal Grandmother     ROS:  Pertinent items are noted in HPI.  Otherwise, a comprehensive ROS was negative.  Exam:   BP 120/76   Pulse 72   Resp 16   Ht 5' 4.25" (1.632 m)   Wt 117 lb (53.1 kg)   LMP 05/23/2011   BMI 19.93 kg/m  Height: 5' 4.25" (163.2 cm) Ht Readings from Last 3 Encounters:  02/04/17 5' 4.25" (1.632 m)  01/02/16 5' 4.25" (1.632 m)  12/26/14 5' 4.7" (1.643 m)    General appearance: alert, cooperative and appears stated age Head: Normocephalic, without obvious abnormality, atraumatic Neck: no adenopathy, supple, symmetrical, trachea midline and thyroid normal to inspection and palpation Lungs: clear to auscultation bilaterally Breasts: normal appearance, no masses or tenderness, No nipple retraction or dimpling, No nipple discharge or bleeding, No axillary or supraclavicular adenopathy Heart: regular rate and rhythm Abdomen: soft, non-tender; no masses,  no organomegaly Extremities: extremities normal, atraumatic, no cyanosis or edema Skin: Skin color, texture, turgor normal. No rashes or lesions Lymph nodes:  Cervical, supraclavicular, and axillary nodes normal. No abnormal inguinal nodes palpated Neurologic: Grossly normal   Pelvic: External genitalia:  no lesions              Urethra:  normal appearing urethra with no masses, tenderness or lesions              Bartholin's and Skene's: normal                 Vagina: normal appearing vagina with normal color and discharge, no lesions              Cervix: multiparous appearance, no cervical motion tenderness and no lesions              Pap taken: Yes.   Bimanual Exam:  Uterus:  normal size, contour, position, consistency, mobility,  non-tender and anteverted              Adnexa: normal adnexa and no mass, fullness, tenderness               Rectovaginal: Confirms               Anus:  normal sphincter tone, no lesions  Chaperone present: yes  A:  Well Woman with normal exam  Menopausal no HRT  Atrophic vaginitis with Premarin use with good results  Cholesterol with management with PCP  P:   Reviewed health and wellness pertinent to exam  Aware need to advise if vaginal bleeding  Discussed risks and benefits and side effects of Premarin use for atrophy and desires continuance.   Rx Premarin cream see order  Pap smear: yes   counseled on breast self exam, mammography screening, feminine hygiene, adequate intake of calcium and vitamin D, diet and exercise  return annually or prn  An After Visit Summary was printed and given to the patient.

## 2017-02-04 NOTE — Patient Instructions (Signed)

## 2017-02-06 LAB — CYTOLOGY - PAP
Diagnosis: NEGATIVE
HPV (WINDOPATH): NOT DETECTED

## 2017-09-10 ENCOUNTER — Other Ambulatory Visit: Payer: Self-pay | Admitting: Certified Nurse Midwife

## 2017-09-10 ENCOUNTER — Telehealth: Payer: Self-pay | Admitting: Certified Nurse Midwife

## 2017-09-10 DIAGNOSIS — Z1231 Encounter for screening mammogram for malignant neoplasm of breast: Secondary | ICD-10-CM

## 2017-09-10 NOTE — Telephone Encounter (Signed)
Patient is having right breast tenderness. °

## 2017-09-10 NOTE — Telephone Encounter (Signed)
Return call to patient. She feels like she possibly has a breast cyst on her R breast. Feels some tenderness near nipple. Has a history of breast cysts. Due for screening and tried to schedule this morning and was unable to. Office visit tomorrow with Leota Sauerseborah Leonard CNM for breast check.  Encounter closed.

## 2017-09-11 ENCOUNTER — Other Ambulatory Visit: Payer: Self-pay

## 2017-09-11 ENCOUNTER — Ambulatory Visit (INDEPENDENT_AMBULATORY_CARE_PROVIDER_SITE_OTHER): Payer: 59 | Admitting: Certified Nurse Midwife

## 2017-09-11 ENCOUNTER — Encounter: Payer: Self-pay | Admitting: Certified Nurse Midwife

## 2017-09-11 VITALS — BP 104/64 | HR 68 | Resp 16 | Ht 64.25 in | Wt 120.0 lb

## 2017-09-11 DIAGNOSIS — N6310 Unspecified lump in the right breast, unspecified quadrant: Secondary | ICD-10-CM | POA: Diagnosis not present

## 2017-09-11 NOTE — Progress Notes (Signed)
Patient scheduled while in office for Bilateral Dx MMG and right breast US, if needed, at [facility] on 10/05/17 at 2:50pm. Patient due for screening MMG on or after 10/03/17, declined earlier appt for right breast Dx MMG and US. Patient verbalizes understanding and is agreeable.

## 2017-09-11 NOTE — Addendum Note (Signed)
Addended by: Leda MinHAMM, Willer Osorno N on: 09/11/2017 03:04 PM   Modules accepted: Orders

## 2017-09-11 NOTE — Progress Notes (Signed)
   Subjective:   57 y.o. MarriedCaucasian female presents for evaluation of right breast mass. Onset of the symptoms wasweek. Patient sought evaluation because of breast lump and history of cyst in breast previously.  Contributing factors include PGM late age 57 breast cancer. Denies any health changes.. Patient denies Hiistory of trauma, bites, or injuries. Last mammogram was 11 months ago with previous cysts noted.  Previous evaluation has included diagnostic/US.   Review of Systems Pertinent items are noted in HPI. Pertinent items noted in HPI and remainder of comprehensive ROS otherwise negative.@SUBJECTIVE    Objective:   General appearance: alert, cooperative, appears stated age and no distress  Breasts: normal appearance, no masses or tenderness, No nipple retraction or dimpling, No nipple discharge or bleeding, No axillary or supraclavicular adenopathy, right breast cysts noted at 3 o'clock and 9 o'clock, slightly tender, history of cysts  Left breast: no abnormal areas noted  Assessment:   ASSESSMENT:Patient is diagnosed with normal breast exam on left, right breast cystic feel. History of breast cyst in this breast.  Plan:   PLAN: Discussed need for evaluation with diagnostic mammogram and US. Will schedule prior to leaving today. Patient agreeable.

## 2017-10-05 ENCOUNTER — Ambulatory Visit: Payer: 59

## 2017-10-05 ENCOUNTER — Ambulatory Visit
Admission: RE | Admit: 2017-10-05 | Discharge: 2017-10-05 | Disposition: A | Discharge status: Home / Self Care | Payer: 59 | Source: Ambulatory Visit | Attending: Certified Nurse Midwife | Admitting: Certified Nurse Midwife

## 2017-10-05 DIAGNOSIS — N6310 Unspecified lump in the right breast, unspecified quadrant: Secondary | ICD-10-CM

## 2017-10-07 ENCOUNTER — Telehealth: Payer: Self-pay | Admitting: *Deleted

## 2017-10-07 NOTE — Telephone Encounter (Signed)
-----   Message from Verner Chol, CNM sent at 10/07/2017  7:52 AM EDT ----- Notify patient have reviewed mammogram and cysts are decreasing in size and no other concerns noted. If tenderness is still present will need OV, if not no exam needed. Recheck at aex, prn and mammogram yearly as discussed.

## 2017-10-07 NOTE — Telephone Encounter (Signed)
Notes recorded by Leda MinHamm, Kadeen Sroka N, RN on 10/07/2017 at 11:49 AM EDT Left message to call Noreene LarssonJill at [phone].

## 2017-10-12 NOTE — Telephone Encounter (Signed)
Spoke with patient, advised as seen below per Leota Sauers, CNM. Patient states tenderness has resolved, no OV scheduled. Patient verbalizes understanding and is agreeable.  Routing to provider for final review. Patient is agreeable to disposition. Will close encounter.

## 2018-02-05 ENCOUNTER — Other Ambulatory Visit: Payer: Self-pay

## 2018-02-05 ENCOUNTER — Encounter: Payer: Self-pay | Admitting: Certified Nurse Midwife

## 2018-02-05 ENCOUNTER — Ambulatory Visit (INDEPENDENT_AMBULATORY_CARE_PROVIDER_SITE_OTHER): Payer: 59 | Admitting: Certified Nurse Midwife

## 2018-02-05 VITALS — BP 110/78 | HR 72 | Resp 14 | Ht 64.5 in | Wt 121.2 lb

## 2018-02-05 DIAGNOSIS — N898 Other specified noninflammatory disorders of vagina: Secondary | ICD-10-CM | POA: Diagnosis not present

## 2018-02-05 DIAGNOSIS — N952 Postmenopausal atrophic vaginitis: Secondary | ICD-10-CM

## 2018-02-05 DIAGNOSIS — Z01419 Encounter for gynecological examination (general) (routine) without abnormal findings: Secondary | ICD-10-CM

## 2018-02-05 NOTE — Progress Notes (Signed)
58 y.o. G52P2002 Married  Caucasian Fe here for annual exam. Menopausal denies vaginal bleeding except for occasional with sexual activity. Uses Premarin cream twice weekly with good results for vaginal pain. Still having vaginal dryness, has tried Astroglide with no change.  Sees PCP Dr. Azucena Cecil for aex, labs and cholesterol management. All stable per patient. No other health issues today. 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  Patient's last menstrual period was 05/23/2011.          Sexually active: Yes.    The current method of family planning is post menopausal status.    Exercising: Yes.    walking, running Smoker:  no  Review of Systems  Constitutional: Negative.   HENT: Negative.   Eyes: Negative.   Respiratory: Negative.   Cardiovascular: Negative.   Gastrointestinal: Negative.   Genitourinary:       Pain or bleeding with intercourse   Musculoskeletal: Negative.   Skin: Negative.   Neurological: Negative.   Endo/Heme/Allergies: Negative.   Psychiatric/Behavioral: Negative.     Health Maintenance: Pap:  02-04-17 negative, HR HPV negative           12-26-14 negative  History of Abnormal Pap: no MMG:  10-05-17 density C/BIRADS 2 benign  Self Breast exams: yes Colonoscopy:  2016 polyps- 5 year follow up  BMD:   none TDaP:  ? 2018 with PCP  Shingles: no Pneumonia: no Hep C and HIV: 2017 hep C negative  Labs: PCP   reports that she has never smoked. She has never used smokeless tobacco. She reports current alcohol use. She reports that she does not use drugs.  Past Medical History:  Diagnosis Date  . Acid reflux   . Colon polyps   . Fibrocystic breast   . MVP (mitral valve prolapse)     Past Surgical History:  Procedure Laterality Date  . DILATATION & CURRETTAGE/HYSTEROSCOPY WITH RESECTOCOPE N/A 07/05/2013   Procedure: DILATATION & CURETTAGE/HYSTEROSCOPY WITH RESECTOCOPE of polyp;  Surgeon: Jacqualin Combes de Gwenevere Ghazi, MD;  Location: [facility];  Service:  Gynecology;  Laterality: N/A;  . NO PAST SURGERIES      Current Outpatient Medications  Medication Sig Dispense Refill  . atorvastatin (LIPITOR) 10 MG tablet Take 10 mg by mouth daily.    Marland Kitchen BIOTIN PO Take 10,000 mcg by mouth.     . cetirizine (ZYRTEC ALLERGY) 10 MG tablet Take 10 mg by mouth as needed.     . Cimetidine (TAGAMET PO) Take by mouth as needed.    . conjugated estrogens (PREMARIN) vaginal cream Use 1/2 gm twice a week intravaginally 30 g 12  . KRILL OIL PO Take by mouth daily.    . Multiple Vitamins-Minerals (MULTIVITAMIN PO) Take 1 tablet by mouth daily.      No current facility-administered medications for this visit.     Family History  Problem Relation Age of Onset  . Hyperlipidemia Mother   . Hyperlipidemia Father   . Cancer Maternal Grandfather        colon  . Diabetes Maternal Grandfather   . Breast cancer Paternal Grandmother     ROS:  Pertinent items are noted in HPI.  Otherwise, a comprehensive ROS was negative.  Exam:   BP 110/78 (BP Location: Right Arm, Patient Position: Sitting, Cuff Size: Normal)   Pulse 72   Resp 14   Ht 5' 4.5" (1.638 m)   Wt 121 lb 4 oz (55 kg)   LMP 05/23/2011   BMI  20.49 kg/m  Height: 5' 4.5" (163.8 cm) Ht Readings from Last 3 Encounters:  02/05/18 5' 4.5" (1.638 m)  09/11/17 5' 4.25" (1.632 m)  02/04/17 5' 4.25" (1.632 m)    General appearance: alert, cooperative and appears stated age Head: Normocephalic, without obvious abnormality, atraumatic Neck: no adenopathy, supple, symmetrical, trachea midline and thyroid normal to inspection and palpation Lungs: clear to auscultation bilaterally Breasts: normal appearance, no masses or tenderness, No nipple retraction or dimpling, No nipple discharge or bleeding, No axillary or supraclavicular adenopathy Heart: regular rate and rhythm Abdomen: soft, non-tender; no masses,  no organomegaly Extremities: extremities normal, atraumatic, no cyanosis or edema Skin: Skin color,  texture, turgor normal. No rashes or lesions Lymph nodes: Cervical, supraclavicular, and axillary nodes normal. No abnormal inguinal nodes palpated Neurologic: Grossly normal   Pelvic: External genitalia:  no lesions              Urethra:  normal appearing urethra with no masses, tenderness or lesions              Bartholin's and Skene's: normal                 Vagina: normal appearing vagina with normal color and discharge, no lesions              Cervix: no cervical motion tenderness, no lesions and normal appearance              Pap taken: yes Bimanual Exam:  Uterus:  normal size, contour, position, consistency, mobility, non-tender and anteverted              Adnexa: normal adnexa and no mass, fullness, tenderness               Rectovaginal: Confirms               Anus:  normal sphincter tone, no lesions  Chaperone present: yes  A:  Well Woman with normal exam  Menopausal no HRT  Atrophic Vaginitis using Premarin Cream with good results  Vaginal dryness  Cholesterol management with PCP  P:   Reviewed health and wellness pertinent to exam  Aware of need to advise if vaginal bleeding  Risks/benefits/warning signs of Premarin cream, requests continuance  Rx Premarin see order with instructions  Discussed using coconut oil for dryness  Continue follow up with MD as indicated  Pap smear: no   counseled on breast self exam, mammography screening, feminine hygiene, adequate intake of calcium and vitamin D, diet and exercise  return annually or prn  An After Visit Summary was printed and given to the patient.

## 2018-02-05 NOTE — Patient Instructions (Signed)

## 2018-02-15 DIAGNOSIS — Z8669 Personal history of other diseases of the nervous system and sense organs: Secondary | ICD-10-CM | POA: Insufficient documentation

## 2018-02-15 DIAGNOSIS — H9311 Tinnitus, right ear: Secondary | ICD-10-CM | POA: Insufficient documentation

## 2018-02-15 DIAGNOSIS — J342 Deviated nasal septum: Secondary | ICD-10-CM | POA: Insufficient documentation

## 2018-07-02 ENCOUNTER — Other Ambulatory Visit: Payer: Self-pay | Admitting: Certified Nurse Midwife

## 2018-07-02 DIAGNOSIS — N952 Postmenopausal atrophic vaginitis: Secondary | ICD-10-CM

## 2018-07-02 MED ORDER — PREMARIN 0.625 MG/GM VA CREA: TOPICAL_CREAM | VAGINAL | 3 refills | Status: DC

## 2018-07-02 NOTE — Telephone Encounter (Signed)
Spoke with patient. Advised I called [facility] to verify problem with Premarin vaginal cream Rx. They stated we refused Rx electronically stating "not appropriate". Advised patient since AEX and MMG up to date and Ms.Debbie had advised she continue cream on AEX 02-01-18, I don't feel will be a problem with refill. Will send to her to approve refill.  Medication refill request: Premarin vaginal cream Last AEX: 02-05-18 Next AEX: 02-09-19 Last MMG (if hormonal medication request): 10-05-17 Neg/[id number] Refill authorized: Please advise

## 2018-07-02 NOTE — Telephone Encounter (Signed)
Notified patient Rx for Premarin cream sent to pharmacy.

## 2018-07-02 NOTE — Telephone Encounter (Signed)
Patient is calling regarding refill request for Premarin vaginal cream. Patient stated that the pharmacy stated it was denied because "it was not appropriate."

## 2018-07-02 NOTE — Telephone Encounter (Signed)
Returned call to patient. Left message for her to call me. 

## 2018-09-15 ENCOUNTER — Other Ambulatory Visit: Payer: Self-pay | Admitting: Certified Nurse Midwife

## 2018-09-15 ENCOUNTER — Other Ambulatory Visit: Payer: Self-pay | Admitting: Cardiology

## 2018-09-15 DIAGNOSIS — Z1231 Encounter for screening mammogram for malignant neoplasm of breast: Secondary | ICD-10-CM

## 2018-10-27 ENCOUNTER — Other Ambulatory Visit: Payer: Self-pay

## 2018-10-27 ENCOUNTER — Ambulatory Visit
Admission: RE | Admit: 2018-10-27 | Discharge: 2018-10-27 | Disposition: A | Discharge status: Home / Self Care | Payer: 59 | Source: Ambulatory Visit | Attending: Certified Nurse Midwife | Admitting: Certified Nurse Midwife

## 2018-10-27 DIAGNOSIS — Z1231 Encounter for screening mammogram for malignant neoplasm of breast: Secondary | ICD-10-CM

## 2018-10-29 ENCOUNTER — Other Ambulatory Visit: Payer: Self-pay | Admitting: Certified Nurse Midwife

## 2018-10-29 DIAGNOSIS — R928 Other abnormal and inconclusive findings on diagnostic imaging of breast: Secondary | ICD-10-CM

## 2018-11-01 ENCOUNTER — Other Ambulatory Visit: Payer: Self-pay

## 2018-11-01 DIAGNOSIS — Z20822 Contact with and (suspected) exposure to covid-19: Secondary | ICD-10-CM

## 2018-11-03 ENCOUNTER — Other Ambulatory Visit: Payer: Self-pay | Admitting: Certified Nurse Midwife

## 2018-11-03 ENCOUNTER — Ambulatory Visit
Admission: RE | Admit: 2018-11-03 | Discharge: 2018-11-03 | Disposition: A | Discharge status: Home / Self Care | Payer: 59 | Source: Ambulatory Visit | Attending: Certified Nurse Midwife | Admitting: Certified Nurse Midwife

## 2018-11-03 ENCOUNTER — Other Ambulatory Visit: Payer: Self-pay

## 2018-11-03 DIAGNOSIS — R928 Other abnormal and inconclusive findings on diagnostic imaging of breast: Secondary | ICD-10-CM

## 2018-11-03 DIAGNOSIS — R921 Mammographic calcification found on diagnostic imaging of breast: Secondary | ICD-10-CM

## 2018-11-03 LAB — NOVEL CORONAVIRUS, NAA: SARS-CoV-2, NAA: NOT DETECTED

## 2018-11-23 ENCOUNTER — Other Ambulatory Visit: Payer: Self-pay

## 2018-11-23 DIAGNOSIS — Z20822 Contact with and (suspected) exposure to covid-19: Secondary | ICD-10-CM

## 2018-11-25 LAB — NOVEL CORONAVIRUS, NAA: SARS-CoV-2, NAA: NOT DETECTED

## 2019-02-07 ENCOUNTER — Other Ambulatory Visit: Payer: Self-pay

## 2019-02-08 NOTE — Progress Notes (Signed)
59 y.o. G33P2002 Married  Caucasian Fe here for annual exam.  Denies sore throat or cough or exposure to Covid. Patient with temperature today but not complaining of headache or other symptoms. Complaining of urinary burning and slight discomfort with urination. Denies back pain. Vaginal dryness is still present. Sexual activity is still painful at times. Some itching and has been using Nystatin ointment and feels better when occurs about 4 times a year. Denies vaginal bleeding or vaginal discharge that is different. Continues with Premarin cream twice weekly for atrophic vaginitis. Patient aware if temperature is elevated can not be seen for annual. .  Sees PCP for cholesterol management and labs. All stable per patient. Mammogram had call back and will need follow up in 6 months.   Patient's last menstrual period was 05/23/2011.          Sexually active: Yes.    The current method of family planning is post menopausal status.    Exercising: Yes.    walking Smoker:  no  Review of Systems  Constitutional: Negative.   HENT: Negative.   Eyes: Negative.   Respiratory: Negative.   Cardiovascular: Negative.   Gastrointestinal: Negative.   Genitourinary: Positive for dysuria.  Musculoskeletal: Negative.   Skin:       Vaginal irritation occ  Neurological: Negative.   Endo/Heme/Allergies: Negative.   Psychiatric/Behavioral: Negative.     Health Maintenance: Pap:  02-04-17 neg HPV HR neg History of Abnormal Pap: no MMG:  10/2018 bilateral & left breast u/s category c density birads 3:probably benign f/u Self Breast exams: yes Colonoscopy:  2016 polyps f/u 24yrs BMD:   none TDaP:  2018 with pcp per patient Shingles: no Pneumonia: no Hep C and HIV: hep c neg 2017 Labs: poct urine-wbc tr, rbc 1+   reports that she has never smoked. She has never used smokeless tobacco. She reports current alcohol use. She reports that she does not use drugs.  Past Medical History:  Diagnosis Date  .  Acid reflux   . Colon polyps   . Fibrocystic breast   . MVP (mitral valve prolapse)     Past Surgical History:  Procedure Laterality Date  . DILATATION & CURRETTAGE/HYSTEROSCOPY WITH RESECTOCOPE N/A 07/05/2013   Procedure: DILATATION & CURETTAGE/HYSTEROSCOPY WITH RESECTOCOPE of polyp;  Surgeon: Jacqualin Combes de Gwenevere Ghazi, MD;  Location: [facility];  Service: Gynecology;  Laterality: N/A;  . NO PAST SURGERIES      Current Outpatient Medications  Medication Sig Dispense Refill  . atorvastatin (LIPITOR) 10 MG tablet Take 10 mg by mouth daily.    Marland Kitchen BIOTIN PO Take 10,000 mcg by mouth.     . cetirizine (ZYRTEC ALLERGY) 10 MG tablet Take 10 mg by mouth as needed.     . Cimetidine (TAGAMET PO) Take by mouth as needed.    . conjugated estrogens (PREMARIN) vaginal cream Use 1/2 gm twice a week intravaginally 30 g 3  . KRILL OIL PO Take by mouth daily.    . Multiple Vitamins-Minerals (MULTIVITAMIN PO) Take 1 tablet by mouth daily.      No current facility-administered medications for this visit.    Family History  Problem Relation Age of Onset  . Hyperlipidemia Mother   . Hyperlipidemia Father   . Cancer Maternal Grandfather        colon  . Diabetes Maternal Grandfather   . Breast cancer Paternal Grandmother     ROS:  Pertinent items are noted in HPI.  Otherwise,  a comprehensive ROS was negative.  Exam:   BP 114/70   Pulse 68   Temp 99.5 F (37.5 C) (Skin)   Resp 16   Ht 5' 4.5" (1.638 m)   Wt 122 lb (55.3 kg)   LMP 05/23/2011   BMI 20.62 kg/m  Height: 5' 4.5" (163.8 cm) Ht Readings from Last 3 Encounters:  02/09/19 5' 4.5" (1.638 m)  02/05/18 5' 4.5" (1.638 m)  09/11/17 5' 4.25" (1.632 m)    General appearance: alert, cooperative and appears stated age    A:            UTI with hematuria  suspected  History of atrophic vaginitis using Premarin cream with good results  History of vaginal itching Nystatin working well for prn use  Febrile  P:   Discussed with  patient her UTI could be causing fever but will wait on urine micro to decide on treatment.Patient agreeable. Increase water intake and use vaginal cream for dryness as prescribed.  Recommend Covid testing and no annual exam.  Patient testing this am.  Patient will advise of Covid results, no medication given.  Lab: Urine micro and culture will treat if indicated  Warning signs of UTI given  RV prn

## 2019-02-09 ENCOUNTER — Ambulatory Visit (INDEPENDENT_AMBULATORY_CARE_PROVIDER_SITE_OTHER): Payer: 59 | Admitting: Certified Nurse Midwife

## 2019-02-09 ENCOUNTER — Other Ambulatory Visit: Payer: Self-pay

## 2019-02-09 ENCOUNTER — Encounter: Payer: Self-pay | Admitting: Certified Nurse Midwife

## 2019-02-09 ENCOUNTER — Ambulatory Visit: Payer: 59 | Attending: Internal Medicine

## 2019-02-09 VITALS — BP 114/70 | HR 68 | Temp 99.9°F | Resp 16 | Ht 64.5 in | Wt 122.0 lb

## 2019-02-09 DIAGNOSIS — N3090 Cystitis, unspecified without hematuria: Secondary | ICD-10-CM

## 2019-02-09 DIAGNOSIS — R319 Hematuria, unspecified: Secondary | ICD-10-CM

## 2019-02-09 DIAGNOSIS — Z20822 Contact with and (suspected) exposure to covid-19: Secondary | ICD-10-CM

## 2019-02-09 DIAGNOSIS — N390 Urinary tract infection, site not specified: Secondary | ICD-10-CM

## 2019-02-09 LAB — POCT URINALYSIS DIPSTICK
Bilirubin, UA: NEGATIVE
Glucose, UA: NEGATIVE
Ketones, UA: NEGATIVE
Nitrite, UA: NEGATIVE
Protein, UA: NEGATIVE
Urobilinogen, UA: NEGATIVE E.U./dL — AB
pH, UA: 5 (ref 5.0–8.0)

## 2019-02-09 NOTE — Patient Instructions (Signed)
Urinary Tract Infection, Adult A urinary tract infection (UTI) is an infection of any part of the urinary tract. The urinary tract includes:  The kidneys.  The ureters.  The bladder.  The urethra. These organs make, store, and get rid of pee (urine) in the body. What are the causes? This is caused by germs (bacteria) in your genital area. These germs grow and cause swelling (inflammation) of your urinary tract. What increases the risk? You are more likely to develop this condition if:  You have a small, thin tube (catheter) to drain pee.  You cannot control when you pee or poop (incontinence).  You are female, and: ? You use these methods to prevent pregnancy:  A medicine that kills sperm (spermicide).  A device that blocks sperm (diaphragm). ? You have low levels of a female hormone (estrogen). ? You are pregnant.  You have genes that add to your risk.  You are sexually active.  You take antibiotic medicines.  You have trouble peeing because of: ? A prostate that is bigger than normal, if you are female. ? A blockage in the part of your body that drains pee from the bladder (urethra). ? A kidney stone. ? A nerve condition that affects your bladder (neurogenic bladder). ? Not getting enough to drink. ? Not peeing often enough.  You have other conditions, such as: ? Diabetes. ? A weak disease-fighting system (immune system). ? Sickle cell disease. ? Gout. ? Injury of the spine. What are the signs or symptoms? Symptoms of this condition include:  Needing to pee right away (urgently).  Peeing often.  Peeing small amounts often.  Pain or burning when peeing.  Blood in the pee.  Pee that smells bad or not like normal.  Trouble peeing.  Pee that is cloudy.  Fluid coming from the vagina, if you are female.  Pain in the belly or lower back. Other symptoms include:  Throwing up (vomiting).  No urge to eat.  Feeling mixed up (confused).  Being tired  and grouchy (irritable).  A fever.  Watery poop (diarrhea). How is this treated? This condition may be treated with:  Antibiotic medicine.  Other medicines.  Drinking enough water. Follow these instructions at home:  Medicines  Take over-the-counter and prescription medicines only as told by your doctor.  If you were prescribed an antibiotic medicine, take it as told by your doctor. Do not stop taking it even if you start to feel better. General instructions  Make sure you: ? Pee until your bladder is empty. ? Do not hold pee for a long time. ? Empty your bladder after sex. ? Wipe from front to back after pooping if you are a female. Use each tissue one time when you wipe.  Drink enough fluid to keep your pee pale yellow.  Keep all follow-up visits as told by your doctor. This is important. Contact a doctor if:  You do not get better after 1-2 days.  Your symptoms go away and then come back. Get help right away if:  You have very bad back pain.  You have very bad pain in your lower belly.  You have a fever.  You are sick to your stomach (nauseous).  You are throwing up. Summary  A urinary tract infection (UTI) is an infection of any part of the urinary tract.  This condition is caused by germs in your genital area.  There are many risk factors for a UTI. These include having a small, thin   tube to drain pee and not being able to control when you pee or poop.  Treatment includes antibiotic medicines for germs.  Drink enough fluid to keep your pee pale yellow. This information is not intended to replace advice given to you by your health care provider. Make sure you discuss any questions you have with your health care provider. Document Revised: 12/17/2017 Document Reviewed: 07/09/2017 Elsevier Patient Education  2020 [employer]  

## 2019-02-10 ENCOUNTER — Telehealth: Payer: Self-pay | Admitting: *Deleted

## 2019-02-10 LAB — URINALYSIS, MICROSCOPIC ONLY: Casts: NONE SEEN /lpf

## 2019-02-10 LAB — NOVEL CORONAVIRUS, NAA: SARS-CoV-2, NAA: NOT DETECTED

## 2019-02-10 NOTE — Telephone Encounter (Signed)
Patient returned call

## 2019-02-10 NOTE — Telephone Encounter (Signed)
Leda Min, RN  02/10/2019 10:43 AM EST    Left message to call Noreene Larsson, RN at [facility] [phone].

## 2019-02-10 NOTE — Telephone Encounter (Signed)
Spoke with patient, advised per Leota Sauers, CNM. Covid19 test pending, results in Epic. Advised patient our office will f/u with final results once completed and reviewed by provider. Patient verbalizes understanding and is agreeable.   Routing to provider for final review. Patient is agreeable to disposition. Will close encounter.

## 2019-02-10 NOTE — Telephone Encounter (Signed)
-----   Message from Verner Chol, CNM sent at 02/10/2019  8:51 AM EST ----- Notify patient her urine microscopic is essentially normal, culture is pending. Patient was not put on any medication due to felt issues may have been vaginal dryness. She had a temperature so was not examined. Was to have Covid test.  She was here for aex.

## 2019-02-11 LAB — URINE CULTURE

## 2019-02-25 ENCOUNTER — Ambulatory Visit: Payer: 59 | Attending: Internal Medicine

## 2019-02-25 DIAGNOSIS — Z23 Encounter for immunization: Secondary | ICD-10-CM

## 2019-02-25 NOTE — Progress Notes (Signed)
   Covid-19 Vaccination Clinic  Name:  Dana Tucker    MRN: [id number] DOB: [birth year]  02/25/2019  Ms. Lehan was observed post Covid-19 immunization for 15 minutes without incidence. She was provided with Vaccine Information Sheet and instruction to access the V-Safe system.   Ms. Dino was instructed to call 911 with any severe reactions post vaccine: Marland Kitchen Difficulty breathing  . Swelling of your face and throat  . A fast heartbeat  . A bad rash all over your body  . Dizziness and weakness    Immunizations Administered    Name Date Dose VIS Date Route   Pfizer COVID-19 Vaccine 02/25/2019  4:08 PM 0.3 mL 12/24/2018 Intramuscular   Manufacturer: [employer], [employer]   Lot: [id number]   NDC: [address]-2

## 2019-03-20 ENCOUNTER — Ambulatory Visit: Payer: 59 | Attending: Internal Medicine

## 2019-03-20 DIAGNOSIS — Z23 Encounter for immunization: Secondary | ICD-10-CM | POA: Insufficient documentation

## 2019-03-20 NOTE — Progress Notes (Signed)
   Covid-19 Vaccination Clinic  Name:  CHACE BISCH    MRN: [id number] DOB: [birth year]  03/20/2019  Ms. Geigle was observed post Covid-19 immunization for 15 minutes without incident. She was provided with Vaccine Information Sheet and instruction to access the V-Safe system.   Ms. Bowlds was instructed to call 911 with any severe reactions post vaccine: Marland Kitchen Difficulty breathing  . Swelling of face and throat  . A fast heartbeat  . A bad rash all over body  . Dizziness and weakness   Immunizations Administered    Name Date Dose VIS Date Route   Pfizer COVID-19 Vaccine 03/20/2019  8:19 AM 0.3 mL 12/24/2018 Intramuscular   Manufacturer: [employer], [employer]   Lot: [id number]   NDC: [address]-0

## 2019-04-04 ENCOUNTER — Encounter: Payer: Self-pay | Admitting: Certified Nurse Midwife

## 2019-05-05 ENCOUNTER — Other Ambulatory Visit: Payer: Self-pay

## 2019-05-05 ENCOUNTER — Ambulatory Visit
Admission: RE | Admit: 2019-05-05 | Discharge: 2019-05-05 | Disposition: A | Discharge status: Home / Self Care | Payer: 59 | Source: Ambulatory Visit | Attending: Certified Nurse Midwife | Admitting: Certified Nurse Midwife

## 2019-05-05 DIAGNOSIS — R921 Mammographic calcification found on diagnostic imaging of breast: Secondary | ICD-10-CM

## 2019-09-26 ENCOUNTER — Other Ambulatory Visit: Payer: Self-pay | Admitting: Obstetrics and Gynecology

## 2019-09-26 DIAGNOSIS — R921 Mammographic calcification found on diagnostic imaging of breast: Secondary | ICD-10-CM

## 2019-10-19 ENCOUNTER — Other Ambulatory Visit: Payer: Self-pay | Admitting: *Deleted

## 2019-10-19 DIAGNOSIS — N952 Postmenopausal atrophic vaginitis: Secondary | ICD-10-CM

## 2019-10-19 MED ORDER — PREMARIN 0.625 MG/GM VA CREA: TOPICAL_CREAM | VAGINAL | 0 refills | Status: DC

## 2019-10-19 NOTE — Telephone Encounter (Signed)
Medication refill request: Premarin  Last AEX:  02-09-19 DL  Next AEX: detailed message left for patient to call and schedule after 02-09-20  Last MMG (if hormonal medication request): 05-05-19 density C/BIRADS 3 probably benign, stable left breast calcifications. Scheduled for 6 month f/u on 11-07-19.  Refill authorized: Today, please advise.   Medication pended for #30g, 0RF. Please refill if appropriate.

## 2019-11-07 ENCOUNTER — Other Ambulatory Visit: Payer: Self-pay

## 2019-11-07 ENCOUNTER — Ambulatory Visit
Admission: RE | Admit: 2019-11-07 | Discharge: 2019-11-07 | Disposition: A | Discharge status: Home / Self Care | Payer: 59 | Source: Ambulatory Visit | Attending: Obstetrics and Gynecology | Admitting: Obstetrics and Gynecology

## 2019-11-07 DIAGNOSIS — R921 Mammographic calcification found on diagnostic imaging of breast: Secondary | ICD-10-CM

## 2019-11-13 ENCOUNTER — Other Ambulatory Visit: Payer: Self-pay | Admitting: Obstetrics and Gynecology

## 2019-11-13 DIAGNOSIS — N952 Postmenopausal atrophic vaginitis: Secondary | ICD-10-CM

## 2019-11-14 NOTE — Telephone Encounter (Signed)
Medication refill request: Premarin Cream  Last AEX:  02/05/18 Next AEX: 02/10/20 Last MMG (if hormonal medication request): 11/07/19  Benign  Refill authorized: 30g/0

## 2020-02-10 ENCOUNTER — Ambulatory Visit: Payer: 59 | Admitting: Certified Nurse Midwife

## 2020-02-10 ENCOUNTER — Ambulatory Visit: Payer: 59 | Admitting: Nurse Practitioner

## 2020-12-12 IMAGING — MG DIGITAL DIAGNOSTIC BILAT W/ TOMO W/ CAD
6 of 10 series · 6 of 26 positions shown · non-contrast
Comparison: Previous exam(s).

CLINICAL DATA: 59-year-old female for 1 year follow-up of LEFT
breast calcifications and for annual bilateral mammogram.

EXAM:
DIGITAL DIAGNOSTIC BILATERAL MAMMOGRAM WITH CAD AND TOMO

[L ML]
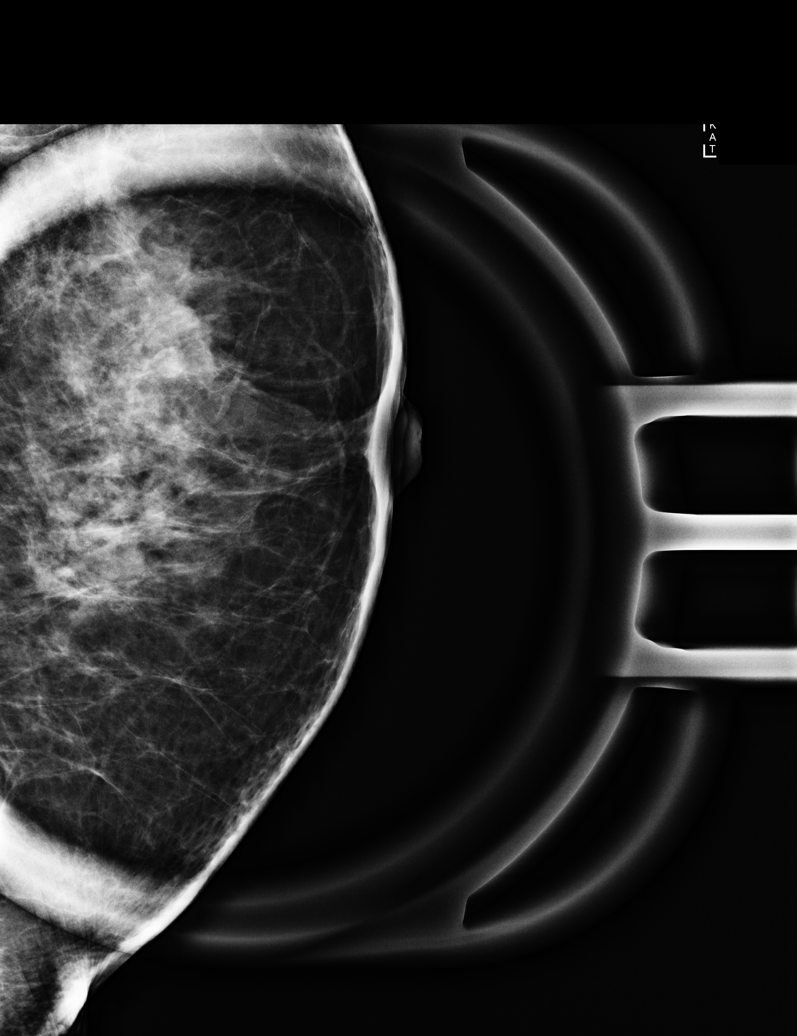

[L CC]
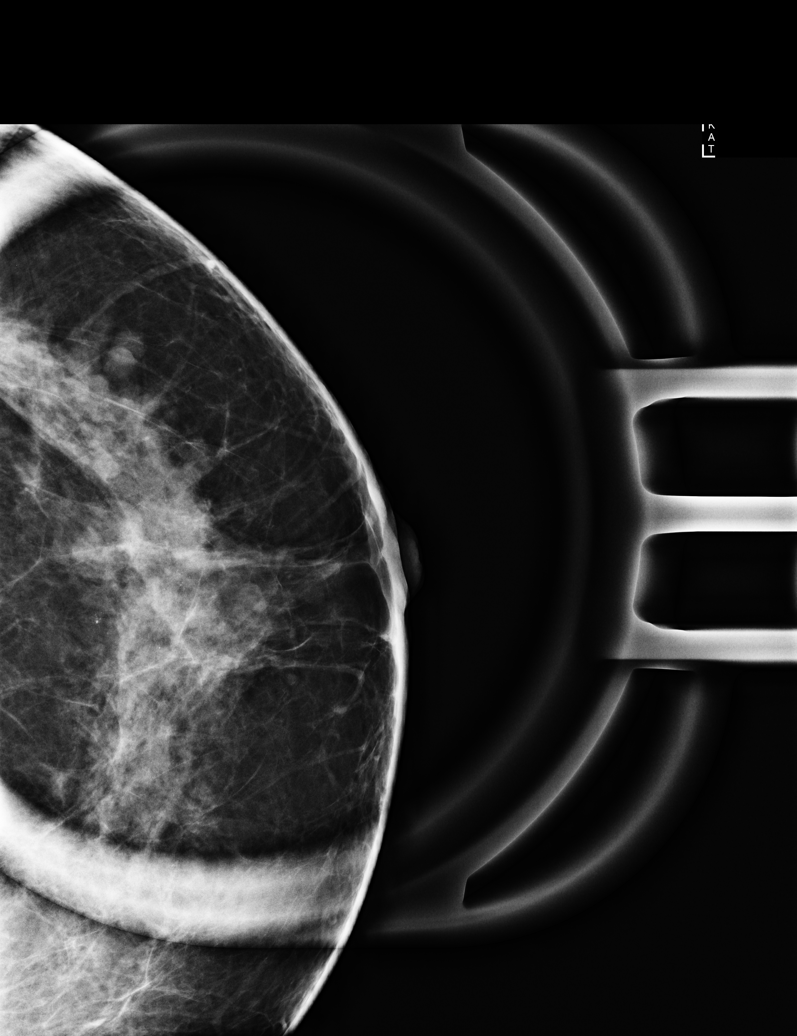

[R MLO synth-2D]
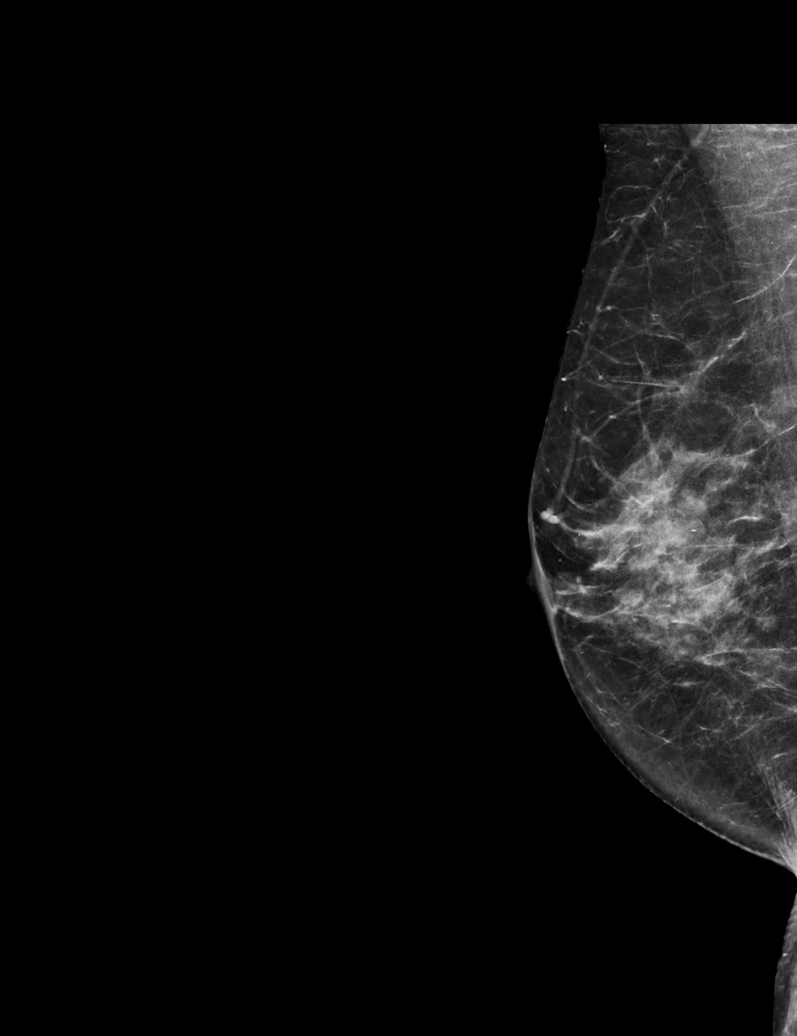

[L CC synth-2D]
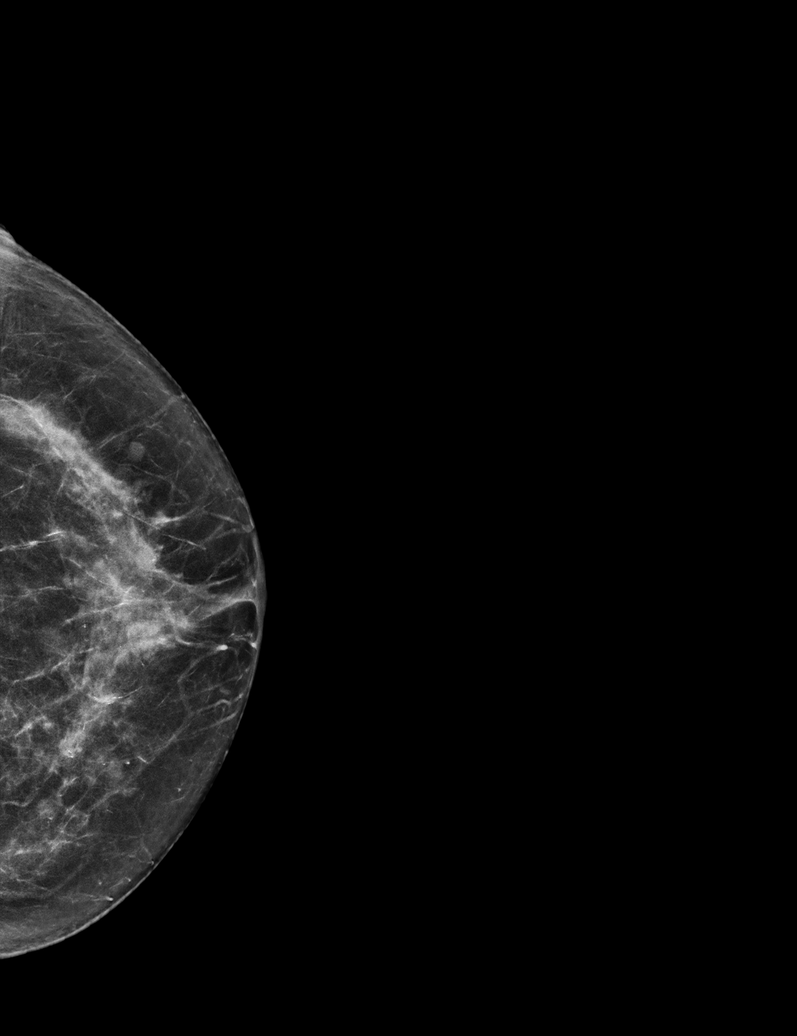

[L MLO synth-2D]
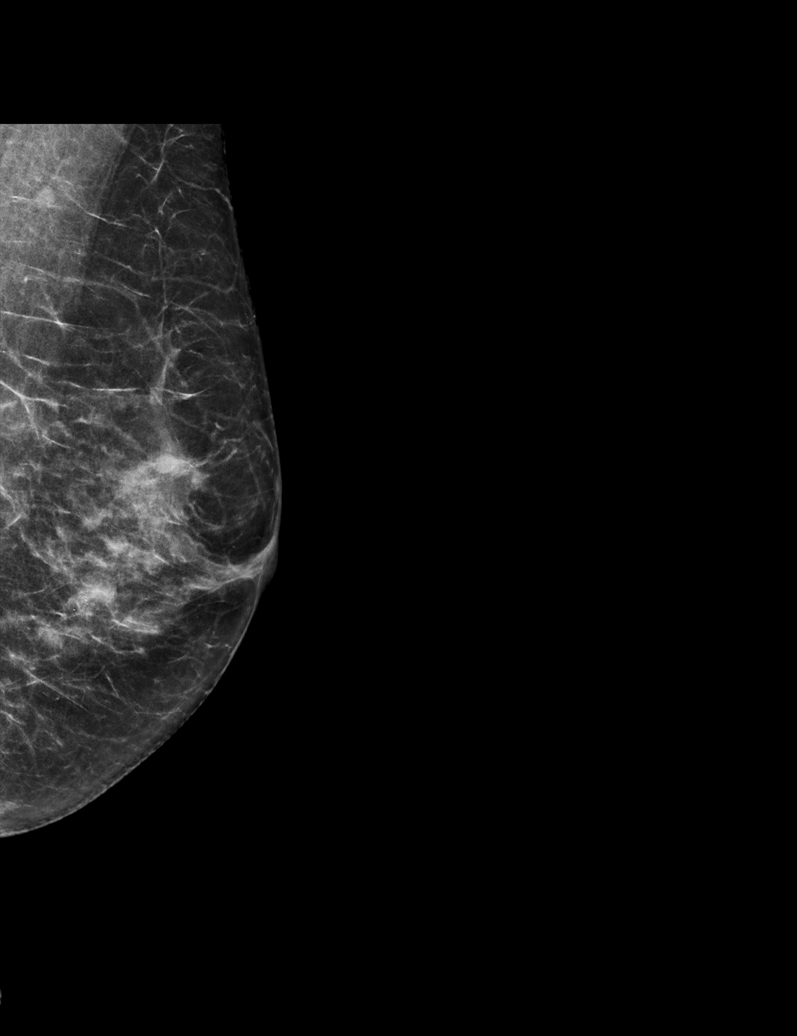

[R CC synth-2D]
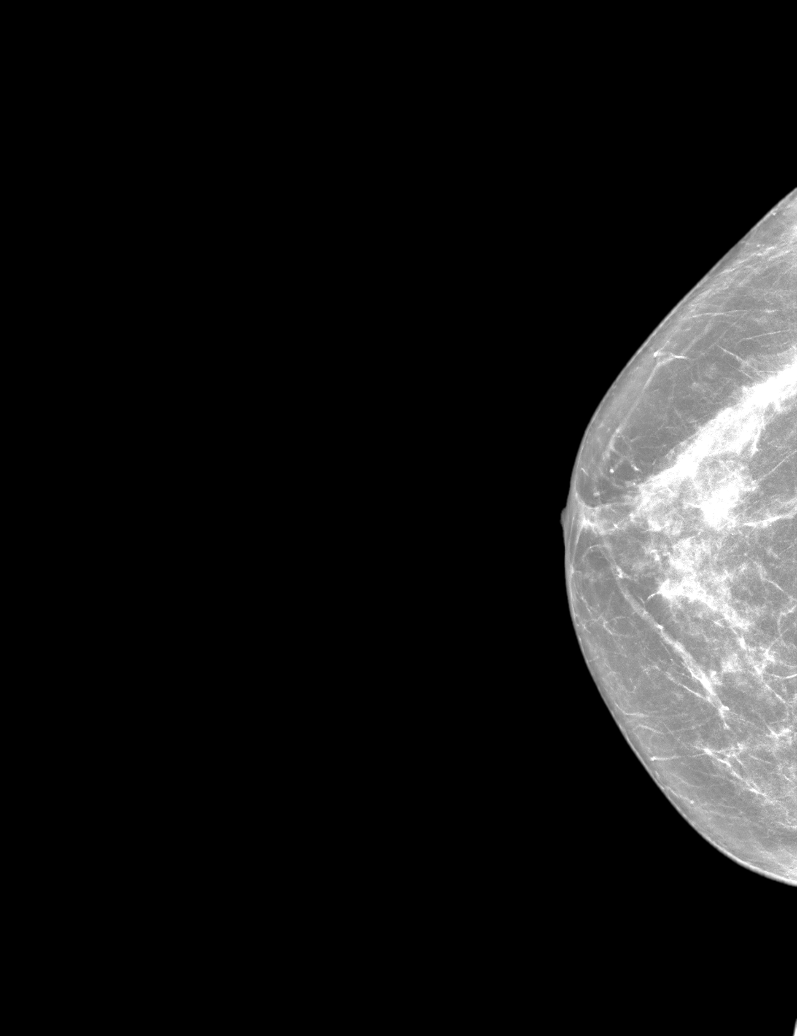

[6 of 26 positions shown; findings below may reference images not displayed]

ACR Breast Density Category c: The breast tissue is heterogeneously
dense, which may obscure small masses.
FINDINGS: 2D/3D full field views and magnification views of the LEFT breast
demonstrate unchanged calcifications within the central LEFT breast.

No new calcifications or suspicious abnormalities noted within
either breast.

Mammographic images were processed with CAD.
IMPRESSION: 1. Stable likely benign LEFT breast calcifications. One year
follow-up recommended to ensure 2 year stability.
2. No suspicious mammographic findings within either breast.

RECOMMENDATION:
Bilateral diagnostic mammogram with magnification views of the LEFT
breast in 1 year.

I have discussed the findings and recommendations with the patient.
If applicable, a reminder letter will be sent to the patient
regarding the next appointment.

BI-RADS CATEGORY  3: Probably benign.
